# Patient Record
Sex: Male | Born: 1970 | Race: White | Hispanic: No | Marital: Married | Smoking: Current some day smoker
Health system: Southern US, Community
[De-identification: ages and names within clinical notes are randomized; demographics above are authoritative.]

## PROBLEM LIST (undated history)

## (undated) ENCOUNTER — Ambulatory Visit: Admission: EM | Payer: Self-pay | Source: Home / Self Care

## (undated) DIAGNOSIS — Z973 Presence of spectacles and contact lenses: Secondary | ICD-10-CM

## (undated) DIAGNOSIS — E785 Hyperlipidemia, unspecified: Secondary | ICD-10-CM

## (undated) DIAGNOSIS — B019 Varicella without complication: Secondary | ICD-10-CM

## (undated) HISTORY — PX: ELBOW SURGERY: SHX618

## (undated) HISTORY — DX: Hyperlipidemia, unspecified: E78.5

## (undated) HISTORY — DX: Varicella without complication: B01.9

## (undated) HISTORY — PX: VASECTOMY: SHX75

---

## 1998-08-13 ENCOUNTER — Emergency Department (HOSPITAL_COMMUNITY): Admission: EM | Admit: 1998-08-13 | Discharge: 1998-08-13 | Payer: Self-pay | Admitting: Emergency Medicine

## 2006-09-02 ENCOUNTER — Ambulatory Visit: Payer: Self-pay | Admitting: Family Medicine

## 2006-09-02 LAB — CONVERTED CEMR LAB
Albumin: 4.6 g/dL (ref 3.5–5.2)
Alkaline Phosphatase: 49 units/L (ref 39–117)
Basophils Absolute: 0.1 10*3/uL (ref 0.0–0.1)
CO2: 32 meq/L (ref 19–32)
Chol/HDL Ratio, serum: 4.6
Glomerular Filtration Rate, Af Am: 124 mL/min/{1.73_m2}
Glucose, Bld: 87 mg/dL (ref 70–99)
LDL DIRECT: 160.3 mg/dL
Monocytes Relative: 8 % (ref 3.0–11.0)
Neutro Abs: 5.7 10*3/uL (ref 1.4–7.7)
Platelets: 278 10*3/uL (ref 150–400)
Potassium: 4.3 meq/L (ref 3.5–5.1)
RDW: 11.7 % (ref 11.5–14.6)
Sodium: 140 meq/L (ref 135–145)
Total Bilirubin: 0.7 mg/dL (ref 0.3–1.2)
Total Protein: 7.8 g/dL (ref 6.0–8.3)
Triglyceride fasting, serum: 101 mg/dL (ref 0–149)
VLDL: 20 mg/dL (ref 0–40)

## 2007-02-22 ENCOUNTER — Ambulatory Visit: Payer: Self-pay | Admitting: Family Medicine

## 2007-02-22 LAB — CONVERTED CEMR LAB
AST: 27 units/L (ref 0–37)
Albumin: 4.2 g/dL (ref 3.5–5.2)
Basophils Absolute: 0 10*3/uL (ref 0.0–0.1)
Bilirubin, Direct: 0.1 mg/dL (ref 0.0–0.3)
CO2: 31 meq/L (ref 19–32)
Cholesterol: 177 mg/dL (ref 0–200)
Creatinine, Ser: 0.8 mg/dL (ref 0.4–1.5)
Eosinophils Relative: 1.5 % (ref 0.0–5.0)
Glucose, Bld: 77 mg/dL (ref 70–99)
HCT: 45.1 % (ref 39.0–52.0)
Hemoglobin: 15.4 g/dL (ref 13.0–17.0)
LDL Cholesterol: 114 mg/dL — ABNORMAL HIGH (ref 0–99)
MCHC: 34.2 g/dL (ref 30.0–36.0)
Monocytes Absolute: 1 10*3/uL — ABNORMAL HIGH (ref 0.2–0.7)
Neutrophils Relative %: 61.3 % (ref 43.0–77.0)
Potassium: 4.1 meq/L (ref 3.5–5.1)
RDW: 11.6 % (ref 11.5–14.6)
Sodium: 142 meq/L (ref 135–145)
Total Bilirubin: 1 mg/dL (ref 0.3–1.2)
Total Protein: 6.8 g/dL (ref 6.0–8.3)
WBC: 10 10*3/uL (ref 4.5–10.5)

## 2008-03-23 ENCOUNTER — Encounter: Payer: Self-pay | Admitting: *Deleted

## 2008-03-23 ENCOUNTER — Telehealth: Payer: Self-pay | Admitting: *Deleted

## 2008-03-29 ENCOUNTER — Telehealth: Payer: Self-pay | Admitting: *Deleted

## 2008-08-02 ENCOUNTER — Ambulatory Visit: Payer: Self-pay | Admitting: Family Medicine

## 2008-08-02 DIAGNOSIS — S92919A Unspecified fracture of unspecified toe(s), initial encounter for closed fracture: Secondary | ICD-10-CM | POA: Insufficient documentation

## 2009-01-29 ENCOUNTER — Ambulatory Visit: Payer: Self-pay | Admitting: Family Medicine

## 2009-01-29 LAB — CONVERTED CEMR LAB
ALT: 18 units/L (ref 0–53)
BUN: 11 mg/dL (ref 6–23)
Basophils Relative: 0 % (ref 0.0–3.0)
Bilirubin Urine: NEGATIVE
Bilirubin, Direct: 0 mg/dL (ref 0.0–0.3)
CO2: 32 meq/L (ref 19–32)
Chloride: 103 meq/L (ref 96–112)
Cholesterol: 218 mg/dL — ABNORMAL HIGH (ref 0–200)
Creatinine, Ser: 0.9 mg/dL (ref 0.4–1.5)
Direct LDL: 146.1 mg/dL
Eosinophils Absolute: 0.1 10*3/uL (ref 0.0–0.7)
Eosinophils Relative: 1.3 % (ref 0.0–5.0)
Glucose, Urine, Semiquant: NEGATIVE
HCT: 46 % (ref 39.0–52.0)
Lymphs Abs: 2.4 10*3/uL (ref 0.7–4.0)
MCHC: 34.5 g/dL (ref 30.0–36.0)
MCV: 91.5 fL (ref 78.0–100.0)
Monocytes Absolute: 0.9 10*3/uL (ref 0.1–1.0)
Neutrophils Relative %: 65.6 % (ref 43.0–77.0)
Platelets: 268 10*3/uL (ref 150.0–400.0)
Potassium: 4.4 meq/L (ref 3.5–5.1)
Specific Gravity, Urine: 1.02
TSH: 1.49 microintl units/mL (ref 0.35–5.50)
Total Bilirubin: 1 mg/dL (ref 0.3–1.2)
Total Protein: 7.4 g/dL (ref 6.0–8.3)
WBC Urine, dipstick: NEGATIVE
WBC: 10 10*3/uL (ref 4.5–10.5)
pH: 7.5

## 2009-02-14 ENCOUNTER — Ambulatory Visit: Payer: Self-pay | Admitting: Family Medicine

## 2009-02-14 DIAGNOSIS — E785 Hyperlipidemia, unspecified: Secondary | ICD-10-CM

## 2009-04-11 ENCOUNTER — Ambulatory Visit: Payer: Self-pay | Admitting: Family Medicine

## 2009-04-11 LAB — CONVERTED CEMR LAB
ALT: 23 units/L (ref 0–53)
AST: 24 units/L (ref 0–37)
Alkaline Phosphatase: 55 units/L (ref 39–117)
Bilirubin, Direct: 0.1 mg/dL (ref 0.0–0.3)
Cholesterol: 184 mg/dL (ref 0–200)
Total Bilirubin: 1 mg/dL (ref 0.3–1.2)
Total Protein: 7.1 g/dL (ref 6.0–8.3)

## 2011-03-20 NOTE — Assessment & Plan Note (Signed)
[facility]                              [facility] OFFICE NOTE   NAME:[name], [name]                         MRN:          [id number]  DATE:09/02/2006                            DOB:          Dec 13, 1970    NEW PATIENT EVALUATION   This is the first visit for this 40 year old single male who comes in today  for evaluation of three problems as a new patient.   PAST MEDICAL HISTORY:  He had surgery in his right elbow as a [age] year old  because it would not straighten out. He does not recall what triggered it,  probably a fracture when he was younger.   OUTPATIENT SURGERY:  None.   ILLNESSES:  None.   INJURIES:  None.   DRUG ALLERGIES:  None.   HABITS:  He quit smoking a year ago. He drinks occasional drink of alcohol.  Takes no medicines on a regular basis.   FAMILY HISTORY:  Dad's is pertinent. His dad died at age 53 of a sudden MI,  underlying smoker. Son does not know any risk factors other than smoking.  Mother is 29 and has had bypass surgery, also a smoker. No brothers, one  sister.   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 He is single. He  has a daughter. He is divorced. Hopes to be remarried. Home is originally  [place].   PROBLEMS:  Allergies. The patient has history of allergic rhinitis. He says  it is worse in the fall, winter, and spring. Still present in the summer but  not as bad. His symptoms are sneezing, runny nose, watery eyes, headache,  congestion, postnasal drip. He said since he quit smoking it is much better.  When he smokes it makes it worse. He has never had any asthma. He takes over-  the-counter Sudafed with not much help. He is also concerned about his blood  pressure. He is also concerned about his lipids. He has never had a physical  evaluation.   PHYSICAL EXAMINATION:  VITAL SIGNS:  Height 5 feet 6 inches, weight 141, BP  134/77, pulse 85 and regular, temp 97.7.  GENERAL:  He is a  well-developed, well-nourished white male in no acute  distress.  HEENT:  Negative. He does have some septal deviation to the right. HEENT  exam, otherwise, nose looks pretty good. Probably mostly a combination of  smoking every now and then and allergies.  NECK:  Supple. Thyroid not enlarged. No carotid bruits.  CHEST:  Clear to auscultation.  CARDIAC:  Negative.  ABDOMEN:  Negative.   IMPRESSION:  Allergic rhinitis.   PLAN:  1. Claritin OTC along with Flonase nasal spray one shot up each nostril      q.h.s.  2. Normal blood pressure.  3. Question hyperlipidemia. Will check labs.   We talked about risk factors for coronary disease. Weight is good for his  height. Blood pressure is normal. We will check lipids, blood sugar,  metabolic parameters. He does smoke every now and then. Advised to stop  completely and not  smoke at all anymore. Will call when we get back his lab  tests.   Thirty minutes was spent with the patient going through his history,  physical, and explanation.    ______________________________  Eugenio Hoes Tawanna Cooler, MD    JAT/MedQ  DD: 09/02/2006  DT: 09/03/2006  Job #: [id number]

## 2013-04-19 ENCOUNTER — Ambulatory Visit (INDEPENDENT_AMBULATORY_CARE_PROVIDER_SITE_OTHER): Payer: BC Managed Care – PPO | Admitting: Nurse Practitioner

## 2013-04-19 ENCOUNTER — Ambulatory Visit (HOSPITAL_BASED_OUTPATIENT_CLINIC_OR_DEPARTMENT_OTHER)
Admission: RE | Admit: 2013-04-19 | Discharge: 2013-04-19 | Disposition: A | Discharge status: Home / Self Care | Payer: BC Managed Care – PPO | Source: Ambulatory Visit | Attending: Nurse Practitioner | Admitting: Nurse Practitioner

## 2013-04-19 ENCOUNTER — Other Ambulatory Visit: Payer: Self-pay | Admitting: Nurse Practitioner

## 2013-04-19 ENCOUNTER — Encounter: Payer: Self-pay | Admitting: Nurse Practitioner

## 2013-04-19 VITALS — BP 90/60 | HR 57 | Temp 97.7°F | Ht 65.5 in | Wt 134.2 lb

## 2013-04-19 DIAGNOSIS — Z0000 Encounter for general adult medical examination without abnormal findings: Secondary | ICD-10-CM

## 2013-04-19 DIAGNOSIS — M546 Pain in thoracic spine: Secondary | ICD-10-CM | POA: Insufficient documentation

## 2013-04-19 DIAGNOSIS — Z1321 Encounter for screening for nutritional disorder: Secondary | ICD-10-CM

## 2013-04-19 DIAGNOSIS — Z130 Encounter for screening for diseases of the blood and blood-forming organs and certain disorders involving the immune mechanism: Secondary | ICD-10-CM

## 2013-04-19 DIAGNOSIS — Z8249 Family history of ischemic heart disease and other diseases of the circulatory system: Secondary | ICD-10-CM

## 2013-04-19 DIAGNOSIS — Z8639 Personal history of other endocrine, nutritional and metabolic disease: Secondary | ICD-10-CM

## 2013-04-19 DIAGNOSIS — M5030 Other cervical disc degeneration, unspecified cervical region: Secondary | ICD-10-CM | POA: Insufficient documentation

## 2013-04-19 DIAGNOSIS — R202 Paresthesia of skin: Secondary | ICD-10-CM

## 2013-04-19 DIAGNOSIS — R209 Unspecified disturbances of skin sensation: Secondary | ICD-10-CM | POA: Insufficient documentation

## 2013-04-19 DIAGNOSIS — Z862 Personal history of diseases of the blood and blood-forming organs and certain disorders involving the immune mechanism: Secondary | ICD-10-CM

## 2013-04-19 LAB — LIPID PANEL
Cholesterol: 212 mg/dL — ABNORMAL HIGH (ref 0–200)
HDL: 57.3 mg/dL (ref >39.00)
Total CHOL/HDL Ratio: 4
Triglycerides: 130 mg/dL (ref 0.0–149.0)
VLDL: 26 mg/dL (ref 0.0–40.0)

## 2013-04-19 LAB — HEPATIC FUNCTION PANEL
ALT: 19 U/L (ref 0–53)
Bilirubin, Direct: 0 mg/dL (ref 0.0–0.3)
Total Bilirubin: 0.8 mg/dL (ref 0.3–1.2)

## 2013-04-19 LAB — CBC
HCT: 46.4 % (ref 39.0–52.0)
MCHC: 33.6 g/dL (ref 30.0–36.0)
MCV: 93.6 fl (ref 78.0–100.0)
RDW: 13.1 % (ref 11.5–14.6)

## 2013-04-19 LAB — RENAL FUNCTION PANEL
CO2: 27 mEq/L (ref 19–32)
Phosphorus: 3.6 mg/dL (ref 2.3–4.6)

## 2013-04-19 NOTE — Patient Instructions (Addendum)
I will call with blood & xray results. Start 81 mg aspirin daily (enteric coated) for stroke prevention. Start fish oil daily (2 gram of DHA & EPA); or eat 6 oz. salmon weekly (about size of hand) for heart health. Caffeine can cause irregular heart rate and interfere with sleep. Hours of sleep goal should be 6 hours uninterrupted. Pleasure to meet you.

## 2013-04-19 NOTE — Progress Notes (Signed)
Subjective:     [name] is a 42 y.o. male and is here to establish care.The patient reports problems - worsening numbness in arms & hands over several weeks, He is especially concerned about this symptom as his sister is being worked-up for multiple sclerosis and had similar symptoms. Also, he c/o thoracic pain between scapula, occasional heartburn relieved w/OTC antacids, and early family history of heart disease in both parents. Also he is a shift worker for 18 years and reports getting 4-5 hours sleep daily in 2 hour increments. He drinks a pot of coffee and 3-4 monster drinks daily. In addtiion he reports drinking approx. 12 beers weekly and smokes about 5 cigarettes week. Marland Kitchen  History   Social History  . Marital Status: Married    Spouse Name: [name]    Number of Children: 2  . Years of Education: N/A   Occupational History  . [employer]    loads trucks   Social History Main Topics  . Smoking status: Current Some Day Smoker -- 1.00 packs/day for 15 years    Types: Cigarettes, Cigars  . Smokeless tobacco: Former Neurosurgeon     Comment: smokes about 5 cigs/week now  . Alcohol Use: 6.0 oz/week    12 drink(s) per week  . Drug Use: No  . Sexually Active: Yes -- Male partner(s)   Other Topics Concern  . Not on file   Social History Narrative   Lives with wife and 79 year old daughter and 81 year old son. Lived in [place] for about 2 years, recently moved back to [facility]. Has worked 2nd or 3rd shift for 18 years. Currently working 3rd and keeps 42 year old during day. Sleeps in 2 hour increments-gets about 4-5 hours sleep daily. Drinks a pot of coffee plus 3-4 monster drinks daily to stay awake.   Health Maintenance  Topic Date Due  . Influenza Vaccine  07/03/2013  . Tetanus/tdap  02/15/2019    The following portions of the patient's history were reviewed and updated as appropriate: allergies, current medications, past family history, past medical history, past social history, past surgical  history and problem list.  Review of Systems Constitutional: negative, often sleepy, drinks caffeine to stay awake Eyes: has prescription for glasses, but rarely wears them Ears, nose, mouth, throat, and face: had URI recently w.sore throat & runny nose. Symtpoms resolved. Respiratory: negative for asthma, cough, dyspnea on exertion and wheezing Cardiovascular: negative for irregular heart beat, lower extremity edema, near-syncope, orthopnea, palpitations and c/o occasional pressure in low R chest w/deep breathing Gastrointestinal: negative for abdominal pain, constipation, diarrhea, nausea and c/o occasional heartburn relieved w/OTC antacid Integument/breast: negative for rash Musculoskeletal:positive for back pain and lumbar region. Pt lifts boxes on job, repetitive. Neurological: positive for paresthesia and weakness in grip, bilateral Allergic/Immunologic: negative   Objective:    BP 90/60  Pulse 57  Temp(Src) 97.7 F (36.5 C) (Oral)  Ht 5' 5.5" (1.664 m)  Wt 134 lb 4 oz (60.895 kg)  BMI 21.99 kg/m2  SpO2 97% General appearance: alert, cooperative, appears stated age, fatigued, no distress and fell asleep while waiting for paperwork after lab draw. Head: Normocephalic, without obvious abnormality, atraumatic Eyes: negative findings: lids and lashes normal, conjunctivae and sclerae normal, corneas clear, pupils equal, round, reactive to light and accomodation and no nystagmus appreciated on exam Ears: normal TM and external ear canal left ear and abnormal TM right ear - air-fluid level Nose: Nares normal. Septum midline. Mucosa normal. No  drainage or sinus tenderness. Throat: lips, mucosa, and tongue normal; teeth and gums normal Neck: no adenopathy, no carotid bruit, no JVD, supple, symmetrical, trachea midline and thyroid not enlarged, symmetric, no tenderness/mass/nodules Back: symmetric, no curvature. ROM normal. No CVA tenderness. Lungs: clear to auscultation  bilaterally Heart: regular rate and rhythm, S1, S2 normal, no murmur, click, rub or gallop Abdomen: soft, non-tender; bowel sounds normal; no masses,  no organomegaly Extremities: extremities normal, atraumatic, no cyanosis or edema and flat arches, no c/o pain Pulses: 2+ and symmetric Skin: Skin color, texture, turgor normal. No rashes or lesions Lymph nodes: no cervical or supraclavicular LAD Neurologic:bilateral grip is subjectively weak, also R patellar reflex is hyperreflexive compared to L     Assessment:   Family history of heart disease in male family member before age 69  Family history of heart disease in male family member before age 9  Preventative health care - Plan: CBC, Hepatic function panel, Lipid panel, Renal function panel  Numbness and tingling in hands - Plan: CBC, DG Cervical Spine 2 or 3 views, DG Thoracic Spine 2 View, Vitamin D 25 hydroxy, Vitamin B12, TSH  History of hyperlipidemia  Encounter for vitamin deficiency screening       Plan:    1-2. 81 mg ASA for stroke prevention. Cautioned against high caffeine intake-risk arrythmia.  3. Tdap up to date:given 11/2010 4. DD: Vit B def, D def, cervical radiculopathy, other inflammatory neurological disorder 5. 2 gm daily fish oil or 6 oz wild salmon /weekly. Start medication if lipid disorder given fam hx. 6. Screen vit B & D today. See pt instructions. See After Visit Summary for Counseling Recommendations

## 2013-04-20 ENCOUNTER — Telehealth: Payer: Self-pay | Admitting: Nurse Practitioner

## 2013-04-20 DIAGNOSIS — E785 Hyperlipidemia, unspecified: Secondary | ICD-10-CM

## 2013-04-20 DIAGNOSIS — M47812 Spondylosis without myelopathy or radiculopathy, cervical region: Secondary | ICD-10-CM

## 2013-04-20 LAB — LDL CHOLESTEROL, DIRECT: Direct LDL: 141 mg/dL

## 2013-04-20 MED ORDER — DICLOFENAC-MISOPROSTOL 75-0.2 MG PO TBEC: 1.0000 | DELAYED_RELEASE_TABLET | Freq: Two times a day (BID) | ORAL | Status: DC

## 2013-04-20 MED ORDER — SIMVASTATIN 10 MG PO TABS: 10.0000 mg | ORAL_TABLET | Freq: Every day | ORAL | Status: DC

## 2013-04-20 NOTE — Telephone Encounter (Signed)
Plain films of cervical spine show osteophytes at C5-6 and C6-7. After discussing treatment options with pt, he would like to proceed w/conservative Tx of NSAIDS & PT before seeing specialist. Would like to see again in 1 month for response to therapy.

## 2013-04-20 NOTE — Telephone Encounter (Signed)
LDL is  >140, pt has 2 cardiac risk factors: smoking & premature fam Hx. Will start zocor. Vit D in 40's . Recommend 2000iu daily. Discussed all with pt. amswered all questions.

## 2013-05-03 ENCOUNTER — Ambulatory Visit: Payer: BC Managed Care – PPO | Attending: Nurse Practitioner | Admitting: Physical Therapy

## 2013-05-03 DIAGNOSIS — IMO0001 Reserved for inherently not codable concepts without codable children: Secondary | ICD-10-CM | POA: Insufficient documentation

## 2013-05-03 DIAGNOSIS — M25519 Pain in unspecified shoulder: Secondary | ICD-10-CM | POA: Insufficient documentation

## 2013-05-03 DIAGNOSIS — M542 Cervicalgia: Secondary | ICD-10-CM | POA: Insufficient documentation

## 2013-05-17 ENCOUNTER — Ambulatory Visit: Payer: BC Managed Care – PPO | Admitting: Physical Therapy

## 2013-05-24 ENCOUNTER — Ambulatory Visit: Payer: BC Managed Care – PPO | Admitting: Physical Therapy

## 2013-06-19 ENCOUNTER — Ambulatory Visit (INDEPENDENT_AMBULATORY_CARE_PROVIDER_SITE_OTHER): Payer: BC Managed Care – PPO | Admitting: Nurse Practitioner

## 2013-06-19 ENCOUNTER — Encounter: Payer: Self-pay | Admitting: Nurse Practitioner

## 2013-06-19 VITALS — BP 98/58 | HR 68 | Temp 97.9°F | Ht 65.5 in | Wt 133.0 lb

## 2013-06-19 DIAGNOSIS — M47812 Spondylosis without myelopathy or radiculopathy, cervical region: Secondary | ICD-10-CM

## 2013-06-19 DIAGNOSIS — E785 Hyperlipidemia, unspecified: Secondary | ICD-10-CM

## 2013-06-19 MED ORDER — DICLOFENAC-MISOPROSTOL 75-0.2 MG PO TBEC: 1.0000 | DELAYED_RELEASE_TABLET | Freq: Every day | ORAL | Status: DC | PRN

## 2013-06-19 MED ORDER — SIMVASTATIN 10 MG PO TABS: 10.0000 mg | ORAL_TABLET | Freq: Every day | ORAL | Status: DC

## 2013-06-19 NOTE — Progress Notes (Signed)
Subjective:    [name] is here for follow up of elevated cholesterol and arm & hand numbness. Regarding hyperlipidemia, compliance with treatment has been fair -he is taking zocor most days. The patient exercises daily. Patient denies muscle pain associated with his medications. Regarding hand & arm numbness, he has no symptoms at present. C-spine films showed deg changes. He has been taking arthrotec 1 T daily & had several PT visits. He continues to do stretches & exercises at home. Also, he has had job change & is no longer lifting heavy boxes for several hours daily. Of note, he is not working 8 hr night shifts, although he is working from 11p-3a. He says he is getting more sleep.  The following portions of the patient's history were reviewed and updated as appropriate: allergies, current medications, past family history, past medical history, past social history and problem list.  Review of Systems Constitutional: negative for fatigue, fevers, night sweats and weight loss Eyes: positive for contacts/glasses Gastrointestinal: negative for abdominal pain and dyspepsia Musculoskeletal:hand numbness has greatly improved-no longer has numbness when driving Neurological: negative for dizziness, headaches, tremors and weakness Behavioral/Psych: negative for sleep disturbance    Objective:    BP 98/58  Pulse 68  Temp(Src) 97.9 F (36.6 C) (Oral)  Ht 5' 5.5" (1.664 m)  Wt 133 lb (60.328 kg)  BMI 21.79 kg/m2  SpO2 97% General appearance: alert, cooperative, appears stated age and no distress Head: Normocephalic, without obvious abnormality, atraumatic Eyes: negative findings: lids and lashes normal, conjunctivae and sclerae normal, corneas clear and pupils equal, round, reactive to light and accomodation Extremities: extremities normal, atraumatic, no cyanosis or edema Neurologic: Grossly normal, strength equal in hands & arms & LE Back: no point tenderness at spine  Lab Review Lab  Results  Component Value Date   CHOL 212* 04/19/2013   CHOL 184 04/11/2009   CHOL 218* 01/29/2009   HDL 57.30 04/19/2013   HDL 45.40 04/11/2009   HDL 98.11* 01/29/2009   LDLDIRECT 141.0 04/19/2013   LDLDIRECT 146.1 01/29/2009   LDLDIRECT 160.3 09/02/2006      Assessment:   Dyslipidemia: started zocor 6 weeks ago-pt takes several times/week. No c/o SE. Both parents had premature heart disease. Degenerative disc disease w/hand numbness. Symptoms resolved w/arthrotec, stretches, & job change. Has cut caffeine intake way back to 1 energy drink daily. Vit D deficiency Plan:    1. Continue meds, stay active, recheck lipids in 6 months. 2 Continue exercises & strecthes. Take arthrotec as needed. Start Bcomplex supplement. 3 Start Vit D3, 2000iu daily

## 2013-06-19 NOTE — Patient Instructions (Signed)
Congrats on the new jobs! Continue with arthrotec as needed for hand numbness & continue with neck stretches & exercises. Take simvastatin at least 3-4 times weekly & we'll check cholesterol again in 6 months. Consider taking Vitamin B complex daily and vitamin D3 2000 iu daily.  Degenerative Disk Disease Degenerative disk disease is a condition caused by the changes that occur in the cushions of the backbone (spinal disks) as you grow older. Spinal disks are soft and compressible disks located between the bones of the spine (vertebrae). They act like shock absorbers. Degenerative disk disease can affect the whole spine. However, the neck and lower back are most commonly affected. Many changes can occur in the spinal disks with aging, such as:  The spinal disks may dry and shrink.  Small tears may occur in the tough, outer covering of the disk (annulus).  The disk space may become smaller due to loss of water.  Abnormal growths in the bone (spurs) may occur. This can put pressure on the nerve roots exiting the spinal canal, causing pain.  The spinal canal may become narrowed. CAUSES  Degenerative disk disease is a condition caused by the changes that occur in the spinal disks with aging. The exact cause is not known, but there is a genetic basis for many patients. Degenerative changes can occur due to loss of fluid in the disk. This makes the disk thinner and reduces the space between the backbones. Small cracks can develop in the outer layer of the disk. This can lead to the breakdown of the disk. You are more likely to get degenerative disk disease if you are overweight. Smoking cigarettes and doing heavy work such as weightlifting can also increase your risk of this condition. Degenerative changes can start after a sudden injury. Growth of bone spurs can compress the nerve roots and cause pain.  SYMPTOMS  The symptoms vary from person to person. Some people may have no pain, while others have  severe pain. The pain may be so severe that it can limit your activities. The location of the pain depends on the part of your backbone that is affected. You will have neck or arm pain if a disk in the neck area is affected. You will have pain in your back, buttocks, or legs if a disk in the lower back is affected. The pain becomes worse while bending, reaching up, or with twisting movements. The pain may start gradually and then get worse as time passes. It may also start after a major or minor injury. You may feel numbness or tingling in the arms or legs.  DIAGNOSIS  Your caregiver will ask you about your symptoms and about activities or habits that may cause the pain. He or she may also ask about any injuries, diseases, or treatments you have had earlier. Your caregiver will examine you to check for the range of movement that is possible in the affected area, to check for strength in your extremities, and to check for sensation in the areas of the arms and legs supplied by different nerve roots. An X-ray of the spine may be taken. Your caregiver may suggest other imaging tests, such as magnetic resonance imaging (MRI), if needed.  TREATMENT  Treatment includes rest, modifying your activities, and applying ice and heat. Your caregiver may prescribe medicines to reduce your pain and may ask you to do some exercises to strengthen your back. In some cases, you may need surgery. You and your caregiver will decide on  the treatment that is best for you. HOME CARE INSTRUCTIONS   Follow proper lifting and walking techniques as advised by your caregiver.  Maintain good posture.  Exercise regularly as advised.  Perform relaxation exercises.  Change your sitting, standing, and sleeping habits as advised. Change positions frequently.  Lose weight as advised.  Stop smoking if you smoke.  Wear supportive footwear. SEEK MEDICAL CARE IF:  Your pain does not go away within 1 to 4 weeks. SEEK IMMEDIATE  MEDICAL CARE IF:   Your pain is severe.  You notice weakness in your arms, hands, or legs.  You begin to lose control of your bladder or bowel movements. MAKE SURE YOU:   Understand these instructions.  Will watch your condition.  Will get help right away if you are not doing well or get worse. Document Released: 08/16/2007 Document Revised: 01/11/2012 Document Reviewed: 08/16/2007 [facility] Patient Information 2014 [place], [place].

## 2013-12-20 ENCOUNTER — Ambulatory Visit: Payer: BC Managed Care – PPO | Admitting: Nurse Practitioner

## 2014-07-30 IMAGING — CR DG THORACIC SPINE 2V
3 series · 3 of 3 positions shown · non-contrast
Comparison: None.

CLINICAL DATA: Hand and arm numbness

THORACIC SPINE - 2 VIEW

[w t-spine a.p. *]
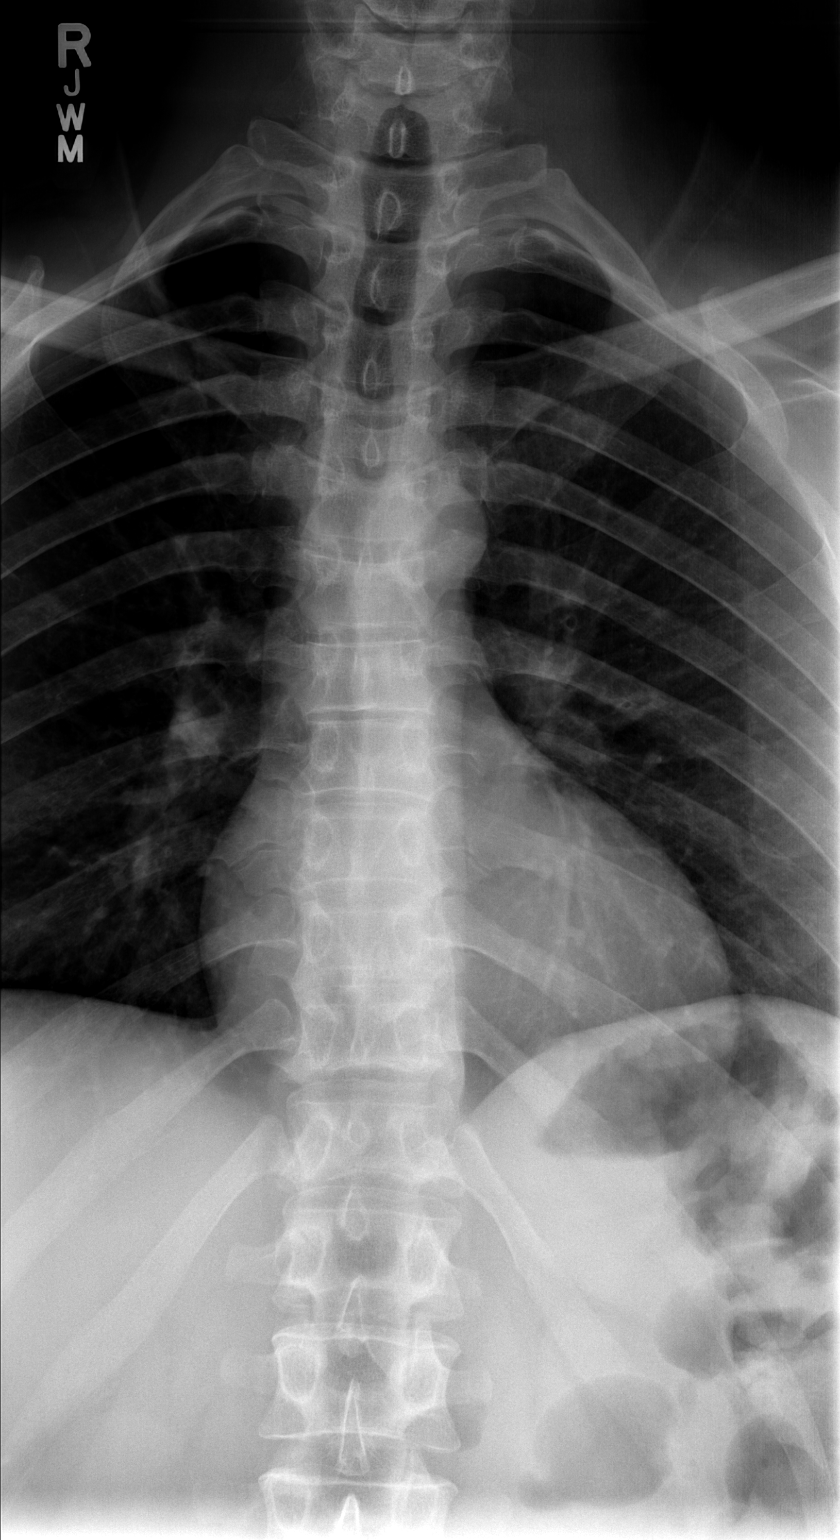

[w t-spine lat *]
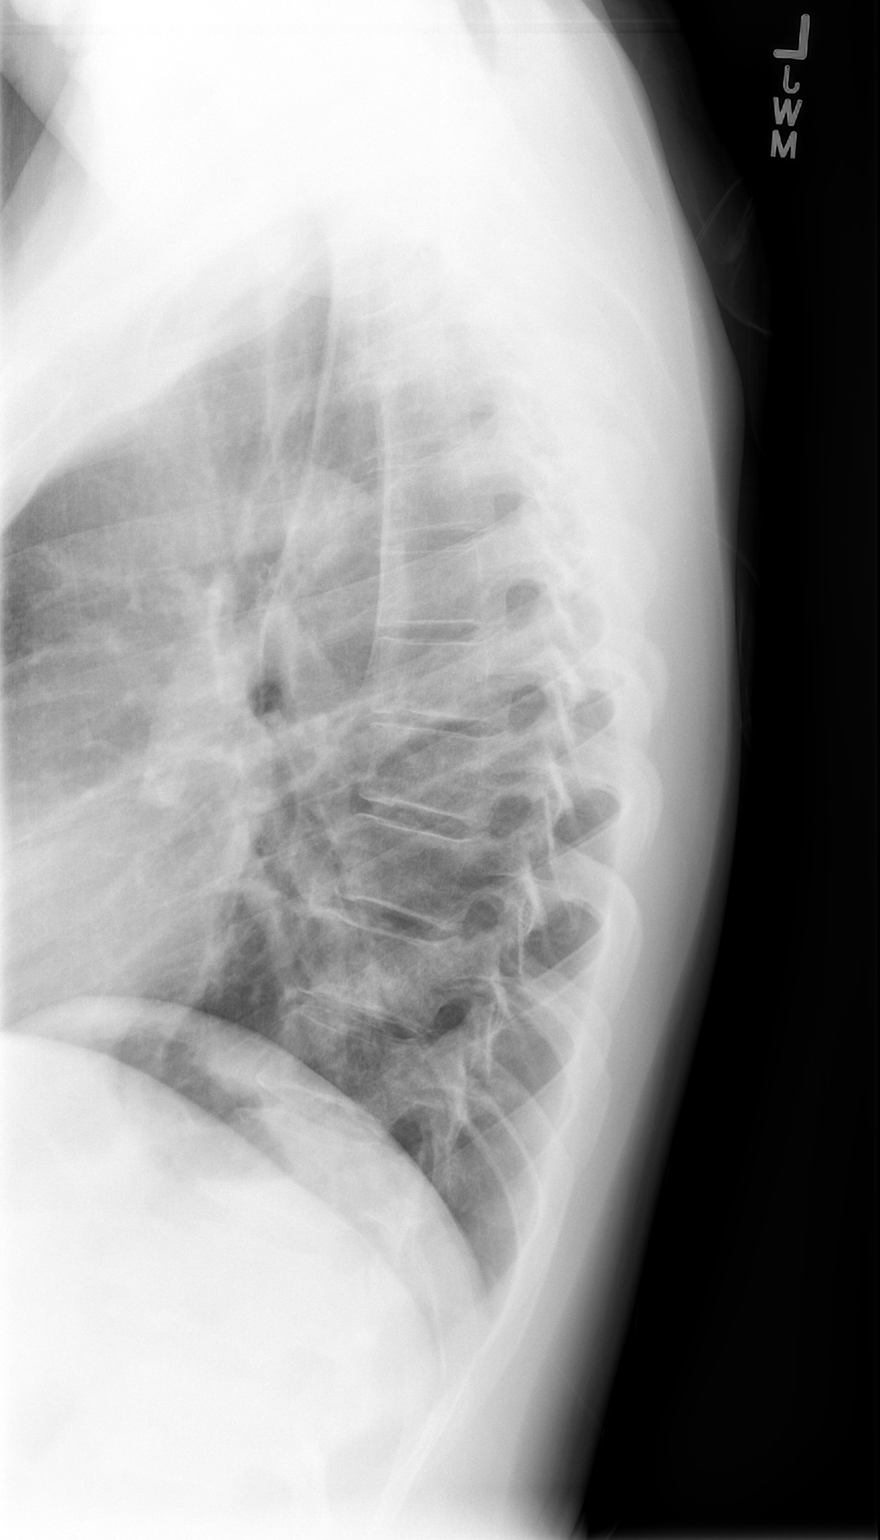

[w swimmers view]
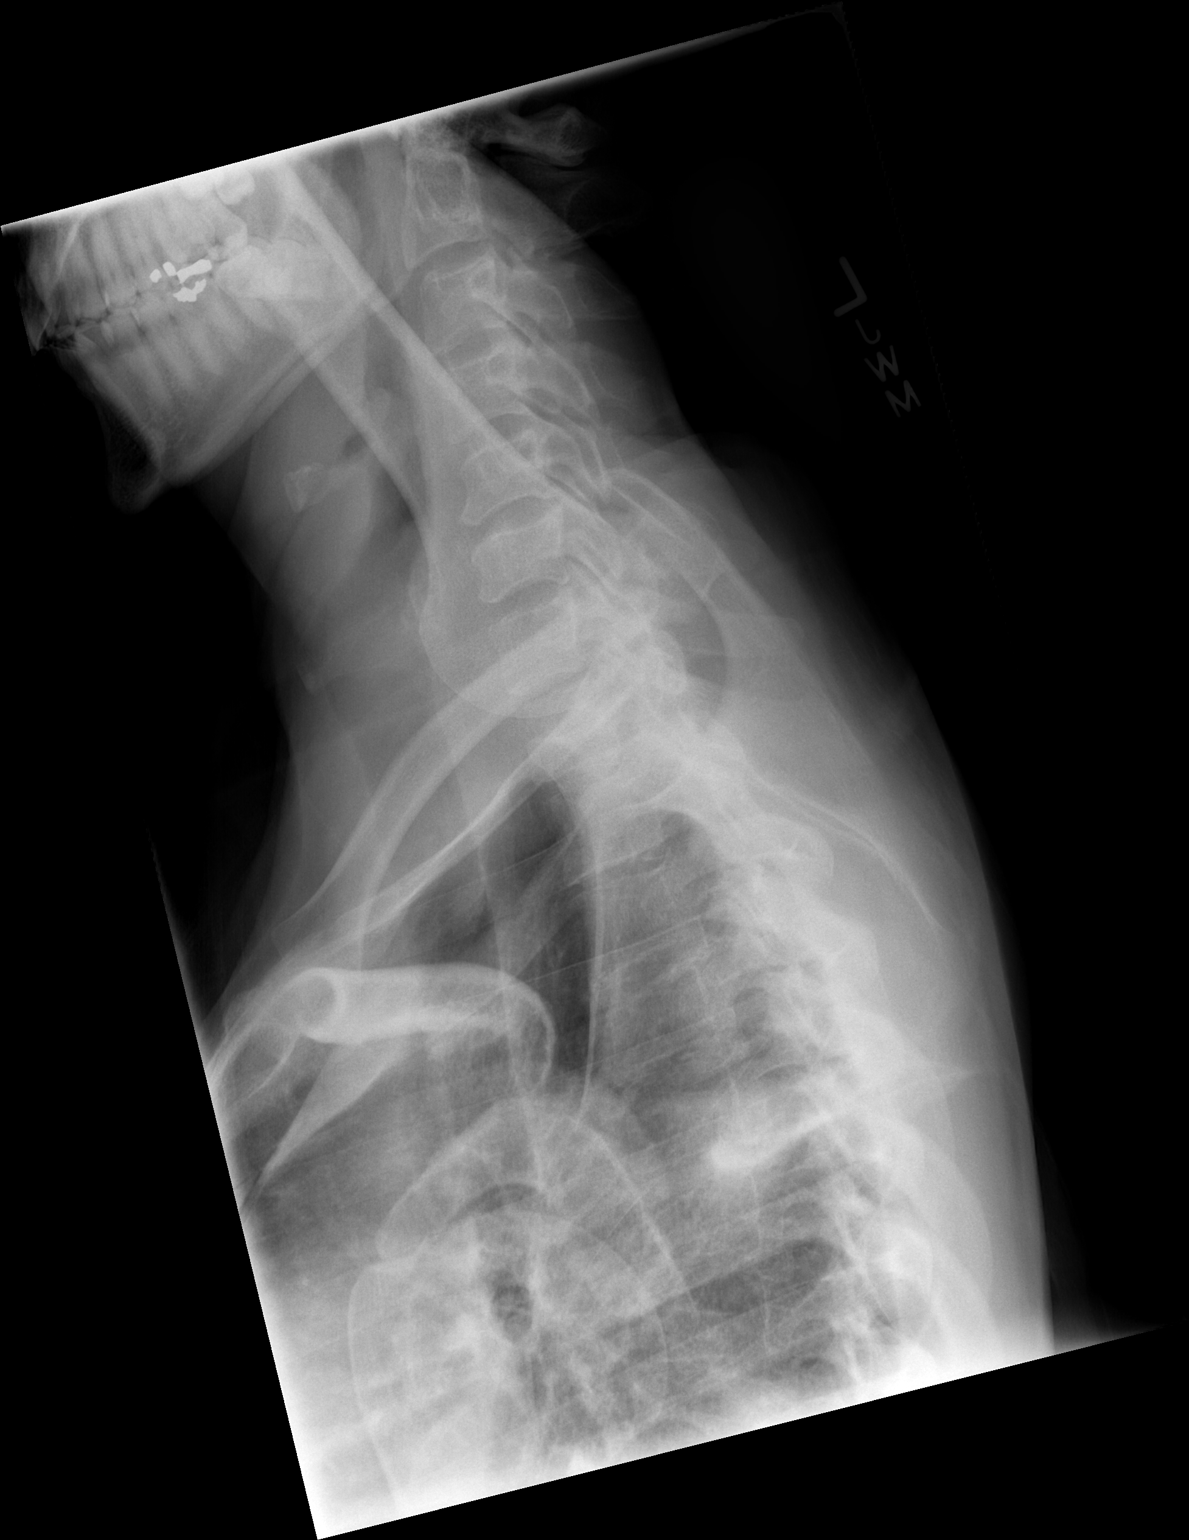

[3 of 3 positions shown; findings below may reference images not displayed]

FINDINGS: Vertebral body height is well-maintained.  The pedicles
within normal limits and no paraspinal mass lesion is noted.
IMPRESSION: No acute abnormality is seen.

## 2014-07-30 IMAGING — CR DG CERVICAL SPINE 2 OR 3 VIEWS
3 series · 3 of 3 positions shown · non-contrast
Comparison: None.

CLINICAL DATA: Arm and hand numbness

CERVICAL SPINE - 2-3 VIEW

[w c-spine lat]
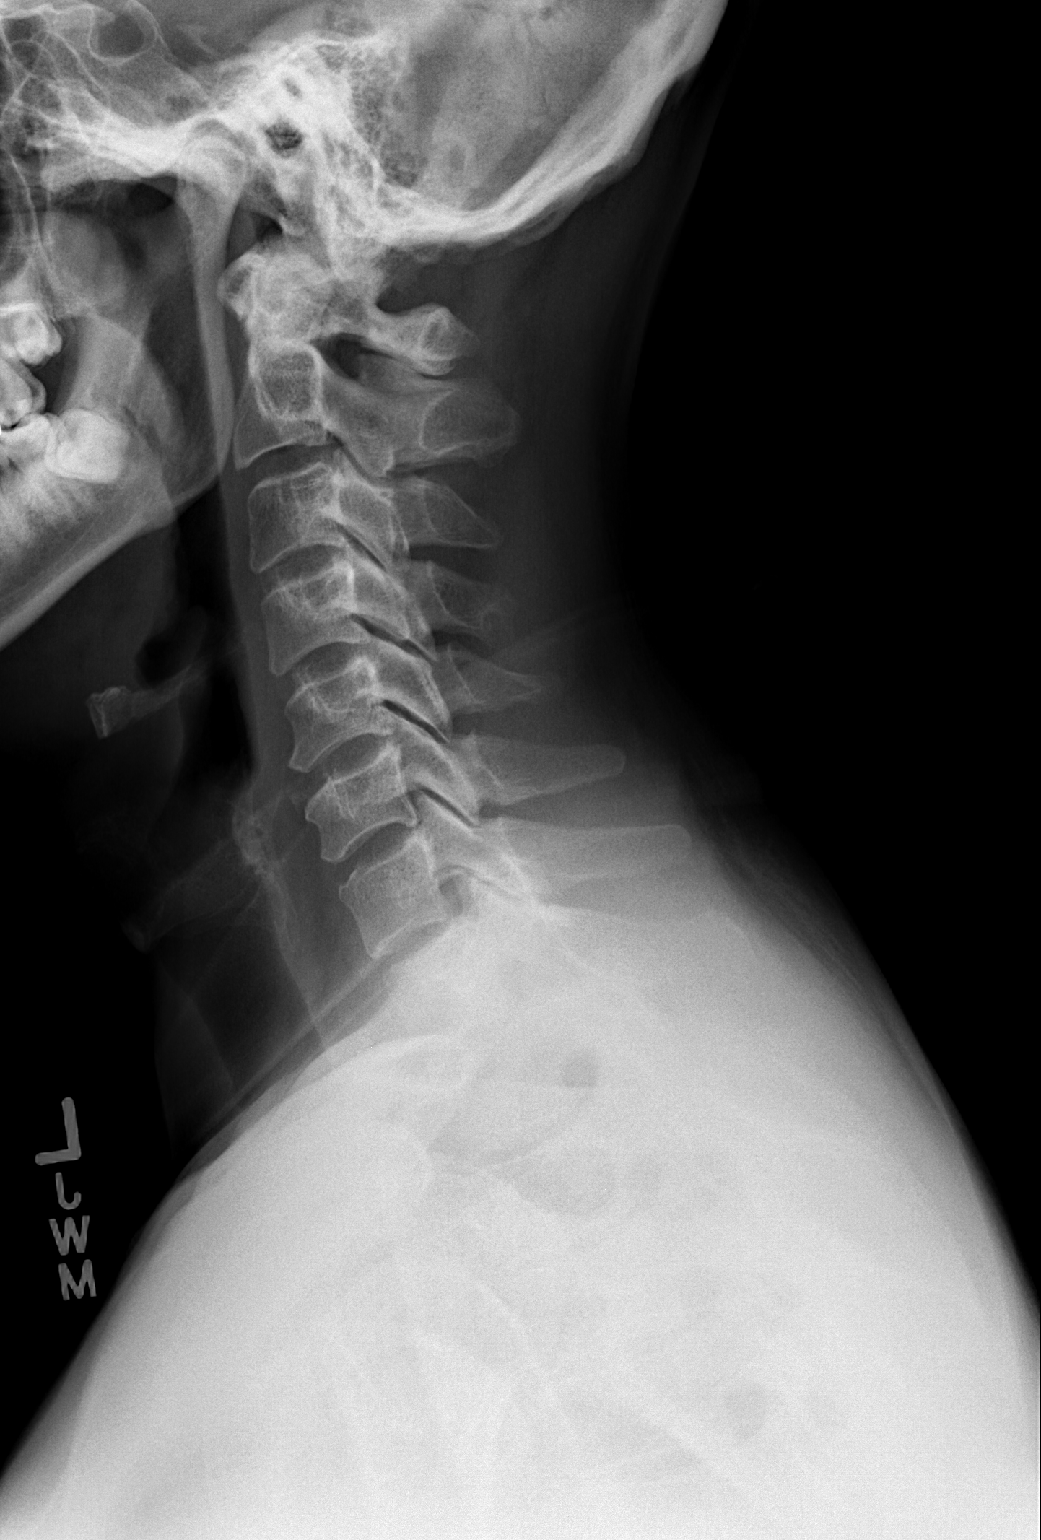

[w c-spine a.p.]
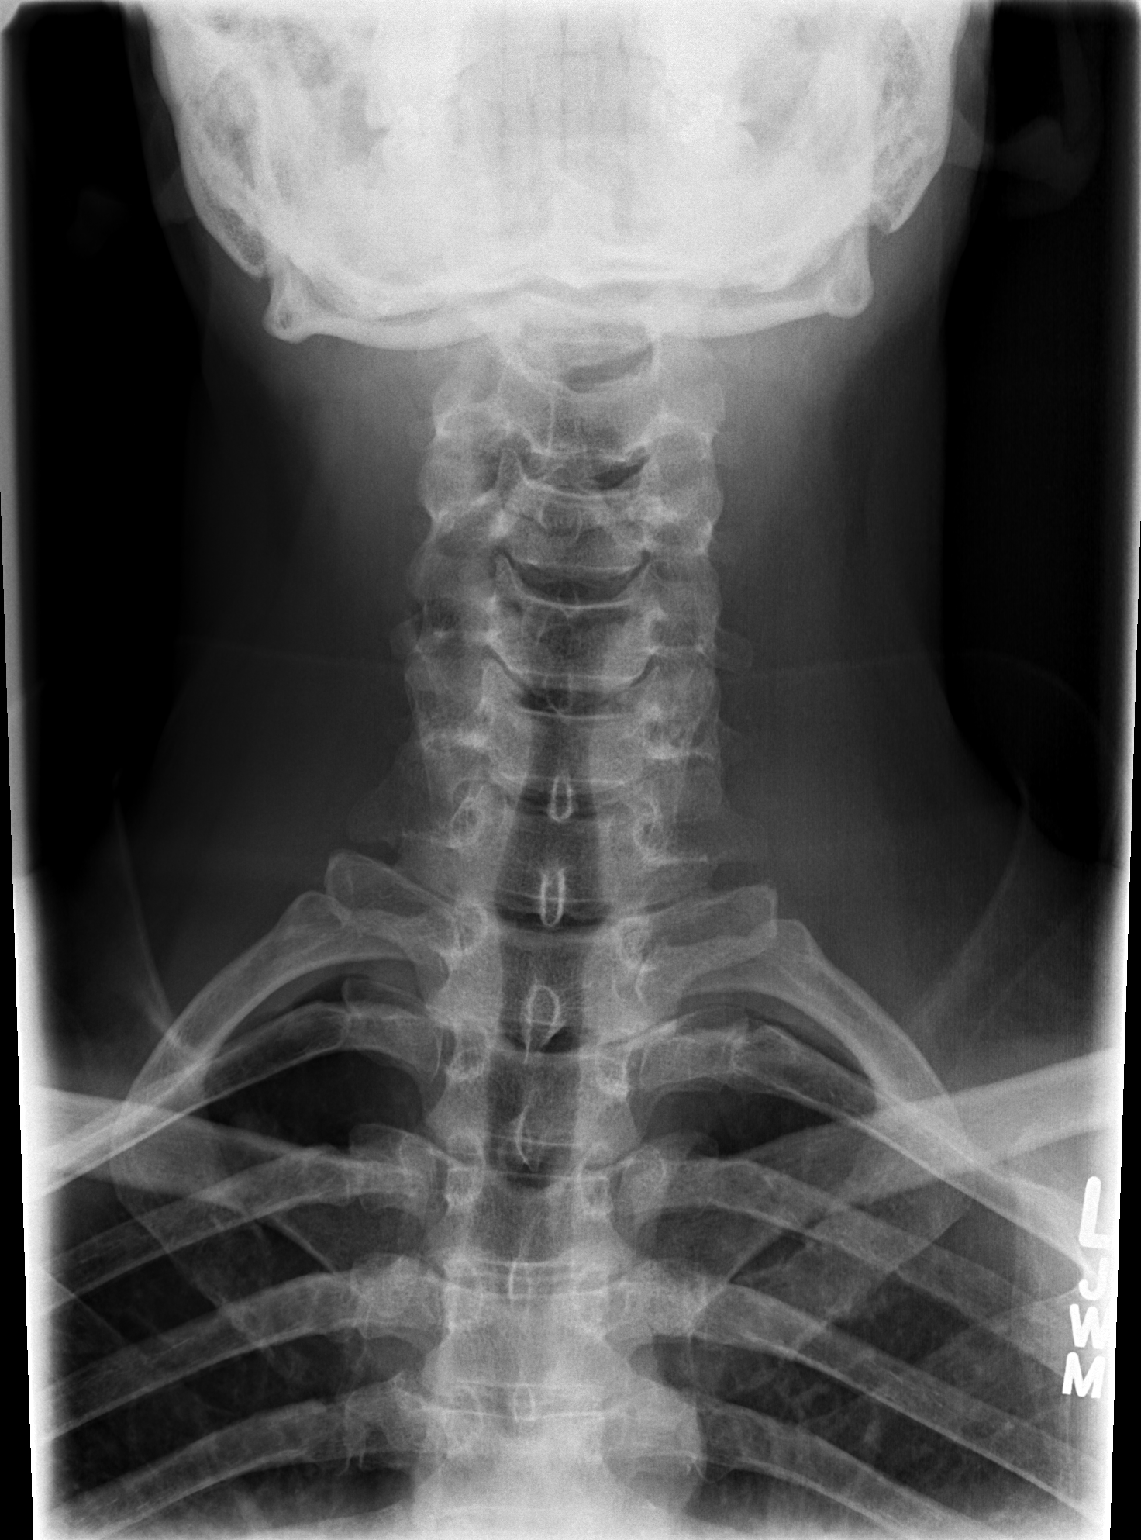

[w c-spine odontoid]
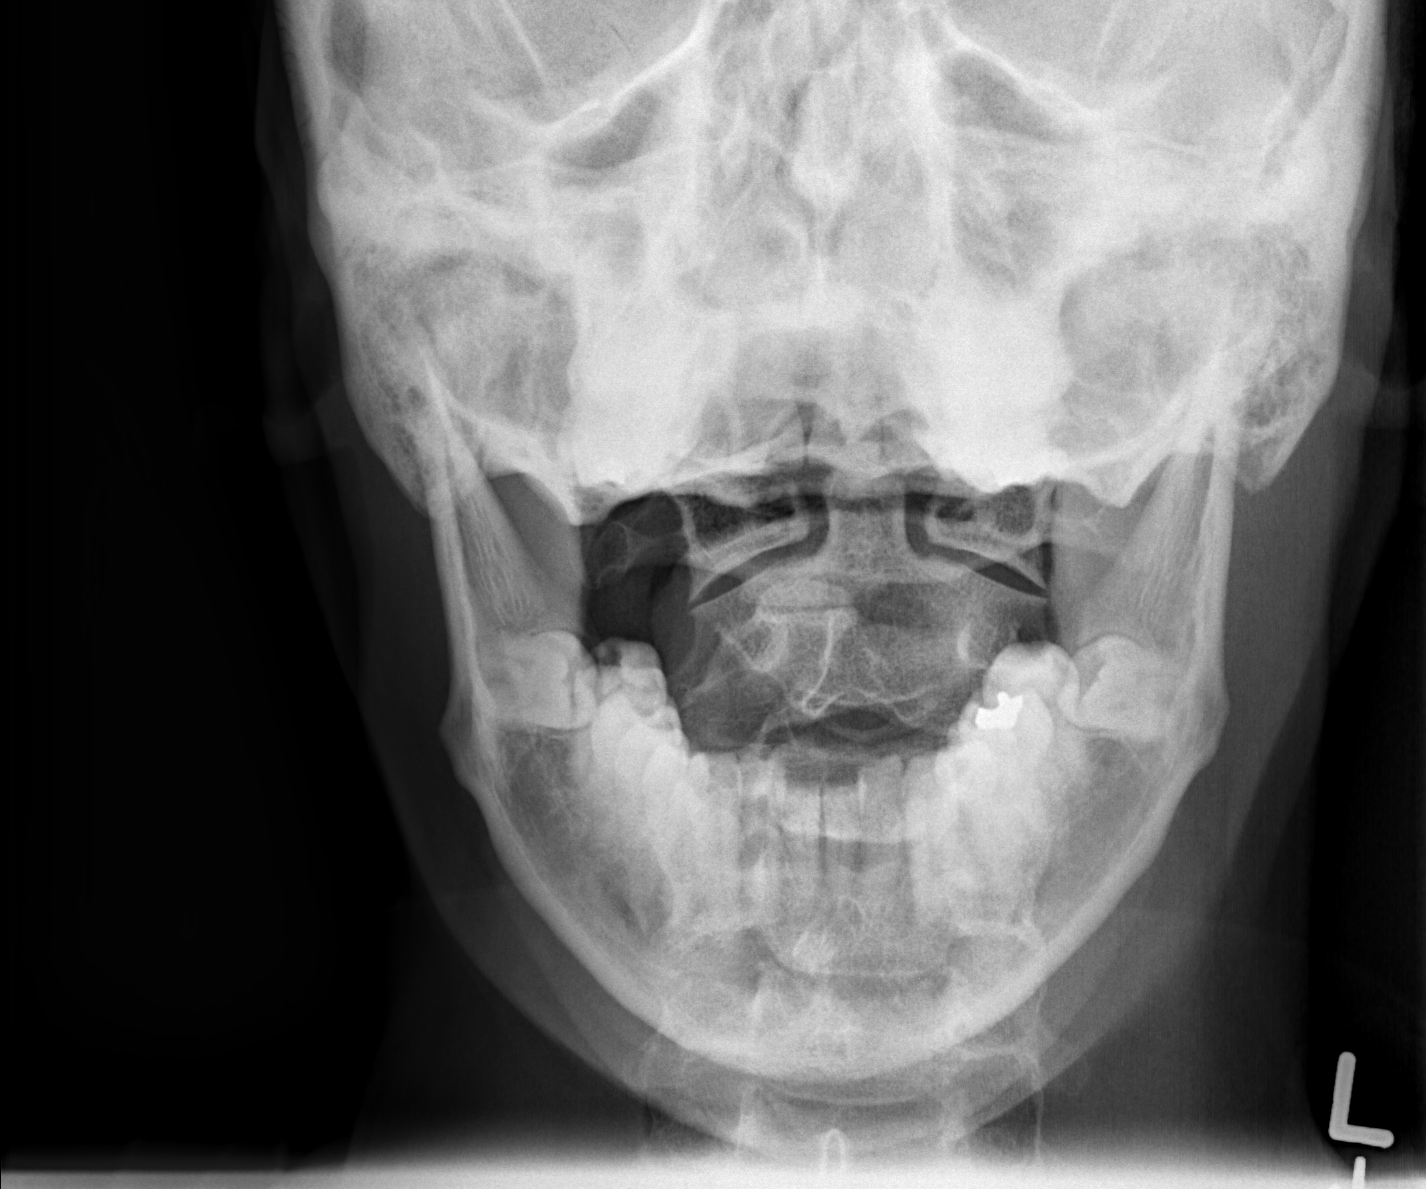

[3 of 3 positions shown; findings below may reference images not displayed]

FINDINGS: Seven cervical segments are well visualized.  Vertebral
body height is well-maintained.  Very minimal osteophytic changes
are noted at C5-6 and C6-7.  The odontoid is within normal limits.
No acute abnormality is noted.
IMPRESSION: Mild degenerative change.  No acute abnormality is seen.

## 2014-08-27 ENCOUNTER — Encounter: Payer: Self-pay | Admitting: Family Medicine

## 2014-08-27 ENCOUNTER — Ambulatory Visit (INDEPENDENT_AMBULATORY_CARE_PROVIDER_SITE_OTHER): Payer: BC Managed Care – PPO | Admitting: Family Medicine

## 2014-08-27 VITALS — BP 143/83 | HR 65 | Temp 97.8°F | Ht 65.5 in | Wt 144.0 lb

## 2014-08-27 DIAGNOSIS — J069 Acute upper respiratory infection, unspecified: Secondary | ICD-10-CM

## 2014-08-27 DIAGNOSIS — I889 Nonspecific lymphadenitis, unspecified: Secondary | ICD-10-CM

## 2014-08-27 DIAGNOSIS — N451 Epididymitis: Secondary | ICD-10-CM

## 2014-08-27 MED ORDER — LEVOFLOXACIN 500 MG PO TABS: 500.0000 mg | ORAL_TABLET | Freq: Every day | ORAL | Status: DC

## 2014-08-27 NOTE — Progress Notes (Signed)
OFFICE NOTE  08/27/2014  CC:  Chief Complaint  Patient presents with  . Groin Pain    Left Side   HPI: Patient is a 43 y.o. Caucasian male who is here for groin pain. Onset 2 days ago, gradually worsening and becoming more constant for about 24h.  Then yesterday and today things have let up some: still with pressure and discomfort.  Works at [employer] and lifts heavy boxes all the time. Points to left side of groin and left testicle.  This area was swollen and a little red.  He applied ice.  Color now looks better and swelling is down some and area not as tender.  He has taken no meds for this.    Has had sinus/nasal congestion, ears feel full.  Sx's for 2 weeks, waxing and waning.  Feels a small lymph node in front of left ear area during this time-not painful to touch. No signif cough.  ST initially but gone now.  HA last week.  No facial pain.  Taking OTC med for cold. +Smoker.   Pertinent PMH:  Past medical, surgical, social, and family history reviewed and no changes are noted since last office visit.  MEDS:  Outpatient Prescriptions Prior to Visit  Medication Sig Dispense Refill  . Diclofenac-Misoprostol 75-0.2 MG TBEC Take 1 tablet by mouth daily as needed.  30 tablet  3  . simvastatin (ZOCOR) 10 MG tablet Take 1 tablet (10 mg total) by mouth at bedtime.  30 tablet  5   No facility-administered medications prior to visit.    PE: Blood pressure 143/83, pulse 65, temperature 97.8 F (36.6 C), temperature source Temporal, height 5' 5.5" (1.664 m), weight 144 lb (65.318 kg), SpO2 100.00%. Gen: Alert, well appearing.  Patient is oriented to person, place, time, and situation. ENT: eyes: no eryth or d/c, nose with scant dried rhinorrhea, ears with normal EACs and TM's bilat. There is a nontender, firm, approx 2 cm left pre-auricular lymph node palpable that is moveable, no overlying skin changes.   CV: RRR, no m/r/g.   LUNGS: CTA bilat, nonlabored resps, good aeration in all lung  fields. GU: no hernia.  Normal penis and testes without lesion.  No scrotal erythema or fullness or mass. Left epididymus is very TTP but not enlarged.   IMPRESSION AND PLAN:  1) Left epididymitis, acute. Levaquin 500 mg qd x 14d. General comfort measures discussed.  2) Left pre-auricular lymph node, nontender, associated with current prolonged URI. Would like to see him back in 2 wks to make sure this node is improved. Symptomatic care for URI discussed.  An After Visit Summary was printed and given to the patient.  FOLLOW UP: 2 wks, f/u node

## 2014-08-27 NOTE — Progress Notes (Signed)
Pre visit review using our clinic review tool, if applicable. No additional management support is needed unless otherwise documented below in the visit note. 

## 2014-08-28 ENCOUNTER — Telehealth: Payer: Self-pay | Admitting: Nurse Practitioner

## 2014-08-28 NOTE — Telephone Encounter (Signed)
emmi emailed °

## 2014-09-10 ENCOUNTER — Ambulatory Visit (INDEPENDENT_AMBULATORY_CARE_PROVIDER_SITE_OTHER): Payer: BC Managed Care – PPO | Admitting: Nurse Practitioner

## 2014-09-10 ENCOUNTER — Encounter: Payer: Self-pay | Admitting: Nurse Practitioner

## 2014-09-10 VITALS — BP 112/68 | HR 65 | Temp 98.1°F | Resp 18 | Ht 65.5 in | Wt 141.0 lb

## 2014-09-10 DIAGNOSIS — R590 Localized enlarged lymph nodes: Secondary | ICD-10-CM | POA: Insufficient documentation

## 2014-09-10 DIAGNOSIS — R599 Enlarged lymph nodes, unspecified: Secondary | ICD-10-CM

## 2014-09-10 NOTE — Patient Instructions (Signed)
Please return in 6 to 8 weeks to re-evaluate lymph node.   If it is not smaller, we will image node or send you to surgeon for biopsy.  If you develop fever or node becomes painful, please let us know, we will want to see you sooner.

## 2014-09-10 NOTE — Progress Notes (Signed)
Pre visit review using our clinic review tool, if applicable. No additional management support is needed unless otherwise documented below in the visit note. 

## 2014-09-10 NOTE — Progress Notes (Signed)
Subjective:     [name] is a 43 y.o. male presents for f/u of epididymitis & L preauricular lymph node swelling. He completed 14 d course levaquin. He states swelling & pain from epididymitis has cleared. He continues to have swollen preauricular lymph node. He thinks it has gotten smaller.  The following portions of the patient's history were reviewed and updated as appropriate: allergies, current medications, past medical history, past social history, past surgical history and problem list.  Review of Systems Constitutional: negative for fatigue, fevers and weight loss Ears, nose, mouth, throat, and face: negative for earaches, hoarseness, nasal congestion and sore throat Respiratory: negative for cough Genitourinary:negative for testicular swelling & pain    Objective:    BP 112/68 mmHg  Pulse 65  Temp(Src) 98.1 F (36.7 C) (Oral)  Resp 18  Ht 5' 5.5" (1.664 m)  Wt 141 lb (63.957 kg)  BMI 23.10 kg/m2  SpO2 100% BP 112/68 mmHg  Pulse 65  Temp(Src) 98.1 F (36.7 C) (Oral)  Resp 18  Ht 5' 5.5" (1.664 m)  Wt 141 lb (63.957 kg)  BMI 23.10 kg/m2  SpO2 100% General appearance: alert, cooperative, appears stated age and no distress Head: Normocephalic, without obvious abnormality, atraumatic Eyes: negative findings: lids and lashes normal and conjunctivae and sclerae normal Ears: normal TM and external ear canal left ear and abnormal TM right ear - air-fluid level Nose: Nares normal. Septum midline. Mucosa normal. No drainage or sinus tenderness. Throat: lips, mucosa, and tongue normal; teeth and gums normal Neck: no adenopathy, supple, symmetrical, trachea midline and thyroid not enlarged, symmetric, no tenderness/mass/nodules Lungs: clear to auscultation bilaterally Heart: regular rate and rhythm, S1, S2 normal, no murmur, click, rub or gallop Lymph nodes: Cervical, supraclavicular, and axillary nodes normal. and swollen moveable L preauricular node approx 2.5 cm wide &  2cm height. Mildly tender to palpation     Assessment:   1. Lymphadenopathy, preauricular Likely due to respiratory infection, resolving F/u in 6 to 8 weeks. If not smaller, will consult surgeon for biopsy.

## 2014-10-02 ENCOUNTER — Encounter: Payer: Self-pay | Admitting: Nurse Practitioner

## 2014-10-02 ENCOUNTER — Ambulatory Visit (INDEPENDENT_AMBULATORY_CARE_PROVIDER_SITE_OTHER): Payer: BC Managed Care – PPO | Admitting: Nurse Practitioner

## 2014-10-02 VITALS — BP 117/79 | HR 61 | Temp 97.9°F | Resp 18 | Ht 65.5 in | Wt 143.0 lb

## 2014-10-02 DIAGNOSIS — J029 Acute pharyngitis, unspecified: Secondary | ICD-10-CM

## 2014-10-02 NOTE — Patient Instructions (Signed)
You have a cold virus causing your symptoms. The average duration of cold symptoms is 14 days. Start daily sinus rinses (neilmed Sinus Rinse). Use 30 mg to 60 mg pseudoephedrine 2 to 3 times daily. Tylenol or ibuprophen for headache. Vicks vapor rub under nose to help breathe. Benzocaine throat lozenges for sore throat. Sip fluids every hour. Rest. If you are not feeling better in 10 days or develop fever or chest pain, call us for re-evaluation. Feel better!  Upper Respiratory Infection, Adult An upper respiratory infection (URI) is also sometimes known as the common cold. The upper respiratory tract includes the nose, sinuses, throat, trachea, and bronchi. Bronchi are the airways leading to the lungs. Most people improve within 1 week, but symptoms can last up to 2 weeks. A residual cough may last even longer.  CAUSES Many different viruses can infect the tissues lining the upper respiratory tract. The tissues become irritated and inflamed and often become very moist. Mucus production is also common. A cold is contagious. You can easily spread the virus to others by oral contact. This includes kissing, sharing a glass, coughing, or sneezing. Touching your mouth or nose and then touching a surface, which is then touched by another person, can also spread the virus. SYMPTOMS  Symptoms typically develop 1 to 3 days after you come in contact with a cold virus. Symptoms vary from person to person. They may include:  Runny nose.  Sneezing.  Nasal congestion.  Sinus irritation.  Sore throat.  Loss of voice (laryngitis).  Cough.  Fatigue.  Muscle aches.  Loss of appetite.  Headache.  Low-grade fever. DIAGNOSIS  You might diagnose your own cold based on familiar symptoms, since most people get a cold 2 to 3 times a year. Your caregiver can confirm this based on your exam. Most importantly, your caregiver can check that your symptoms are not due to another disease such as strep throat,  sinusitis, pneumonia, asthma, or epiglottitis. Blood tests, throat tests, and X-rays are not necessary to diagnose a common cold, but they may sometimes be helpful in excluding other more serious diseases. Your caregiver will decide if any further tests are required. RISKS AND COMPLICATIONS  You may be at risk for a more severe case of the common cold if you smoke cigarettes, have chronic heart disease (such as heart failure) or lung disease (such as asthma), or if you have a weakened immune system. The very young and very old are also at risk for more serious infections. Bacterial sinusitis, middle ear infections, and bacterial pneumonia can complicate the common cold. The common cold can worsen asthma and chronic obstructive pulmonary disease (COPD). Sometimes, these complications can require emergency medical care and may be life-threatening. PREVENTION  The best way to protect against getting a cold is to practice good hygiene. Avoid oral or hand contact with people with cold symptoms. Wash your hands often if contact occurs. There is no clear evidence that vitamin C, vitamin E, echinacea, or exercise reduces the chance of developing a cold. However, it is always recommended to get plenty of rest and practice good nutrition. TREATMENT  Treatment is directed at relieving symptoms. There is no cure. Antibiotics are not effective, because the infection is caused by a virus, not by bacteria. Treatment may include:  Increased fluid intake. Sports drinks offer valuable electrolytes, sugars, and fluids.  Breathing heated mist or steam (vaporizer or shower).  Eating chicken soup or other clear broths, and maintaining good nutrition.  Getting plenty   of rest.  Using gargles or lozenges for comfort.  Controlling fevers with ibuprofen or acetaminophen as directed by your caregiver.  Increasing usage of your inhaler if you have asthma. Zinc gel and zinc lozenges, taken in the first 24 hours of the common  cold, can shorten the duration and lessen the severity of symptoms. Pain medicines may help with fever, muscle aches, and throat pain. A variety of non-prescription medicines are available to treat congestion and runny nose. Your caregiver can make recommendations and may suggest nasal or lung inhalers for other symptoms.  HOME CARE INSTRUCTIONS   Only take over-the-counter or prescription medicines for pain, discomfort, or fever as directed by your caregiver.  Use a warm mist humidifier or inhale steam from a shower to increase air moisture. This may keep secretions moist and make it easier to breathe.  Drink enough water and fluids to keep your urine clear or pale yellow.  Rest as needed.  Return to work when your temperature has returned to normal or as your caregiver advises. You may need to stay home longer to avoid infecting others. You can also use a face mask and careful hand washing to prevent spread of the virus. SEEK MEDICAL CARE IF:   After the first few days, you feel you are getting worse rather than better.  You need your caregiver's advice about medicines to control symptoms.  You develop chills, worsening shortness of breath, or brown or red sputum. These may be signs of pneumonia.  You develop yellow or brown nasal discharge or pain in the face, especially when you bend forward. These may be signs of sinusitis.  You develop a fever, swollen neck glands, pain with swallowing, or white areas in the back of your throat. These may be signs of strep throat. SEEK IMMEDIATE MEDICAL CARE IF:   You have a fever.  You develop severe or persistent headache, ear pain, sinus pain, or chest pain.  You develop wheezing, a prolonged cough, cough up blood, or have a change in your usual mucus (if you have chronic lung disease).  You develop sore muscles or a stiff neck. Document Released: 04/14/2001 Document Revised: 01/11/2012 Document Reviewed: 02/20/2011 [employer] Patient  Information 2014 [employer]. 

## 2014-10-02 NOTE — Progress Notes (Signed)
Pre visit review using our clinic review tool, if applicable. No additional management support is needed unless otherwise documented below in the visit note. 

## 2014-10-02 NOTE — Progress Notes (Signed)
   Subjective:    Patient ID: [name], male    DOB: 09-Dec-1970, 43 y.o.   MRN: [id number]  URI  This is a new problem. The current episode started in the past 7 days (5d). The problem has been unchanged. There has been no fever. Associated symptoms include congestion, coughing, ear pain ("feel stopped up") and a sore throat. Pertinent negatives include no abdominal pain, chest pain, diarrhea or wheezing. He has tried acetaminophen, decongestant and NSAIDs for the symptoms. The treatment provided no relief.      Review of Systems  Constitutional: Negative for fever, chills, activity change, appetite change and fatigue.  HENT: Positive for congestion, ear pain ("feel stopped up"), postnasal drip and sore throat. Negative for voice change.   Respiratory: Positive for cough. Negative for chest tightness, shortness of breath and wheezing.   Cardiovascular: Negative for chest pain.  Gastrointestinal: Negative for abdominal pain and diarrhea.       Objective:   Physical Exam  Constitutional: He is oriented to person, place, and time. He appears well-developed and well-nourished. No distress.  HENT:  Head: Normocephalic and atraumatic.  Right Ear: External ear normal.  Left Ear: External ear normal.  Mouth/Throat: No oropharyngeal exudate.  Erythematous posterior pharynx. Cloudy fluid R TM, bones visible; scant clear fluid L TM, bones visible L preauricular lymph node-no change in sz. Mobile, NT, feels harder than last visit.   Eyes: Conjunctivae are normal. Right eye exhibits no discharge. Left eye exhibits no discharge.  Neck: Normal range of motion. Neck supple. No thyromegaly present.  Cardiovascular: Normal rate, regular rhythm and normal heart sounds.   No murmur heard. Pulmonary/Chest: Effort normal and breath sounds normal. No respiratory distress. He has no wheezes. He has no rales.  Lymphadenopathy:    He has no cervical adenopathy.  Neurological: He is alert and oriented  to person, place, and time.  Skin: Skin is warm and dry.  Psychiatric: He has a normal mood and affect. His behavior is normal. Thought content normal.  Vitals reviewed.         Assessment & Plan:  1. Sore throat Likely viral, also has nasal congestion  - POCT Rapid Strep A; Standing-neg - Upper Respiratory Culture See patient instructions for complete plan. F/u PRN

## 2014-10-04 ENCOUNTER — Telehealth: Payer: Self-pay | Admitting: Nurse Practitioner

## 2014-10-04 NOTE — Telephone Encounter (Signed)
Spoke with pt, advised message about labs. Pt understood.

## 2014-10-04 NOTE — Telephone Encounter (Signed)
pls call pt: Strep culture neg. Viral respiratory/sore throat illness. Continue with comfort measures as discussed. 

## 2014-10-05 LAB — CULTURE, UPPER RESPIRATORY: Organism ID, Bacteria: NORMAL

## 2014-10-22 ENCOUNTER — Ambulatory Visit: Payer: BC Managed Care – PPO | Admitting: Nurse Practitioner

## 2015-01-10 ENCOUNTER — Ambulatory Visit (INDEPENDENT_AMBULATORY_CARE_PROVIDER_SITE_OTHER): Payer: BLUE CROSS/BLUE SHIELD | Admitting: Nurse Practitioner

## 2015-01-10 ENCOUNTER — Encounter: Payer: Self-pay | Admitting: Nurse Practitioner

## 2015-01-10 VITALS — BP 123/86 | HR 78 | Temp 98.3°F | Ht 65.0 in | Wt 139.0 lb

## 2015-01-10 DIAGNOSIS — R599 Enlarged lymph nodes, unspecified: Secondary | ICD-10-CM

## 2015-01-10 DIAGNOSIS — J069 Acute upper respiratory infection, unspecified: Secondary | ICD-10-CM

## 2015-01-10 DIAGNOSIS — B9789 Other viral agents as the cause of diseases classified elsewhere: Secondary | ICD-10-CM

## 2015-01-10 DIAGNOSIS — R590 Localized enlarged lymph nodes: Secondary | ICD-10-CM

## 2015-01-10 MED ORDER — BENZONATATE 200 MG PO CAPS: 200.0000 mg | ORAL_CAPSULE | Freq: Three times a day (TID) | ORAL | Status: DC | PRN

## 2015-01-10 MED ORDER — HYDROCODONE-HOMATROPINE 5-1.5 MG/5ML PO SYRP
5.0000 mL | ORAL_SOLUTION | Freq: Every evening | ORAL | Status: DC | PRN
Start: 2015-01-10 — End: 2015-02-05

## 2015-01-10 NOTE — Progress Notes (Signed)
Pre visit review using our clinic review tool, if applicable. No additional management support is needed unless otherwise documented below in the visit note. 

## 2015-01-10 NOTE — Progress Notes (Signed)
Subjective:     [name] is a 44 y.o. male who c/o nasal congestion, sore throat, HA, cough, ear pain body aches for 5 days. He is taking nyquil w/out relief. Cough is keeping him awake. Chest hurts when coughs in sternal area. He denies fever, diarrhea, nausea.  He also c/o persistent L preauricular LAD. He thinks nodule/lymph node has increased in sz in last few mos. The following portions of the patient's history were reviewed and updated as appropriate: allergies, current medications, past medical history, past social history, past surgical history and problem list.  Review of Systems Constitutional: positive for fatigue, negative for night sweats Musculoskeletal:negative for arthralgias    Objective:    BP 123/86 mmHg  Pulse 78  Temp(Src) 98.3 F (36.8 C) (Temporal)  Ht 5\' 5"  (1.651 m)  Wt 139 lb (63.05 kg)  BMI 23.13 kg/m2  SpO2 97% BP 123/86 mmHg  Pulse 78  Temp(Src) 98.3 F (36.8 C) (Temporal)  Ht 5\' 5"  (1.651 m)  Wt 139 lb (63.05 kg)  BMI 23.13 kg/m2  SpO2 97% General appearance: alert, cooperative, appears stated age and no distress Head: Normocephalic, without obvious abnormality, atraumatic Eyes: negative findings: lids and lashes normal and conjunctivae and sclerae normal Ears: normal TM's and external ear canals both ears Throat: lips, mucosa, and tongue normal; teeth and gums normal Lungs: clear to auscultation bilaterally Heart: regular rate and rhythm, S1, S2 normal, no murmur, click, rub or gallop Lymph nodes: Cervical adenopathy: none, Axillary adenopathy: none and L preauricular node feels spongy-was firm when last examined 3 mos ago, ovoid, 1.5 cm vertical axis. NT Neurologic: Grossly normal    Assessment:Plan     1. Lymphadenopathy, preauricular, exacerbated DD: mass vs LN - US Soft Tissue Head/Neck; Future  2. Viral upper respiratory tract infection with cough, new Symptom management - HYDROcodone-homatropine (HYCODAN) 5-1.5 MG/5ML syrup; Take  5 mLs by mouth at bedtime as needed for cough.  Dispense: 120 mL; Refill: 0 - benzonatate (TESSALON) 200 MG capsule; Take 1 capsule (200 mg total) by mouth 3 (three) times daily as needed for cough.  Dispense: 60 capsule; Refill: 0  See pt instructions F/u PRN [place] results

## 2015-01-10 NOTE — Patient Instructions (Signed)
You have a virus causing your symptoms. The average duration of cold symptoms is 14 days.  For nasal congestion: Start daily sinus rinses (neilmed Sinus Rinse). Use 30 mg to 60 mg pseudoephedrine 2 to 3 times daily. Vicks vapor rub under nose to help breathe. For sore throat: Benzocaine throat lozenges for sore throat. For cough: Take benzonatate capsules when awake for cough. Use cough syrup before sleep. Tylenol or ibuprophen for headache. Sip fluids every hour. Rest. If you are not feeling better in 10 days or develop fever or chest pain, call us for re-evaluation.   Please get ultrasound of lymph node.   Upper Respiratory Infection, Adult An upper respiratory infection (URI) is also sometimes known as the common cold. The upper respiratory tract includes the nose, sinuses, throat, trachea, and bronchi. Bronchi are the airways leading to the lungs. Most people improve within 1 week, but symptoms can last up to 2 weeks. A residual cough may last even longer.  CAUSES Many different viruses can infect the tissues lining the upper respiratory tract. The tissues become irritated and inflamed and often become very moist. Mucus production is also common. A cold is contagious. You can easily spread the virus to others by oral contact. This includes kissing, sharing a glass, coughing, or sneezing. Touching your mouth or nose and then touching a surface, which is then touched by another person, can also spread the virus. SYMPTOMS  Symptoms typically develop 1 to 3 days after you come in contact with a cold virus. Symptoms vary from person to person. They may include:  Runny nose.  Sneezing.  Nasal congestion.  Sinus irritation.  Sore throat.  Loss of voice (laryngitis).  Cough.  Fatigue.  Muscle aches.  Loss of appetite.  Headache.  Low-grade fever. DIAGNOSIS  You might diagnose your own cold based on familiar symptoms, since most people get a cold 2 to 3 times a year. Your  caregiver can confirm this based on your exam. Most importantly, your caregiver can check that your symptoms are not due to another disease such as strep throat, sinusitis, pneumonia, asthma, or epiglottitis. Blood tests, throat tests, and X-rays are not necessary to diagnose a common cold, but they may sometimes be helpful in excluding other more serious diseases. Your caregiver will decide if any further tests are required. RISKS AND COMPLICATIONS  You may be at risk for a more severe case of the common cold if you smoke cigarettes, have chronic heart disease (such as heart failure) or lung disease (such as asthma), or if you have a weakened immune system. The very young and very old are also at risk for more serious infections. Bacterial sinusitis, middle ear infections, and bacterial pneumonia can complicate the common cold. The common cold can worsen asthma and chronic obstructive pulmonary disease (COPD). Sometimes, these complications can require emergency medical care and may be life-threatening. PREVENTION  The best way to protect against getting a cold is to practice good hygiene. Avoid oral or hand contact with people with cold symptoms. Wash your hands often if contact occurs. There is no clear evidence that vitamin C, vitamin E, echinacea, or exercise reduces the chance of developing a cold. However, it is always recommended to get plenty of rest and practice good nutrition. TREATMENT  Treatment is directed at relieving symptoms. There is no cure. Antibiotics are not effective, because the infection is caused by a virus, not by bacteria. Treatment may include:  Increased fluid intake. Sports drinks offer valuable electrolytes,  sugars, and fluids.  Breathing heated mist or steam (vaporizer or shower).  Eating chicken soup or other clear broths, and maintaining good nutrition.  Getting plenty of rest.  Using gargles or lozenges for comfort.  Controlling fevers with ibuprofen or  acetaminophen as directed by your caregiver.  Increasing usage of your inhaler if you have asthma. Zinc gel and zinc lozenges, taken in the first 24 hours of the common cold, can shorten the duration and lessen the severity of symptoms. Pain medicines may help with fever, muscle aches, and throat pain. A variety of non-prescription medicines are available to treat congestion and runny nose. Your caregiver can make recommendations and may suggest nasal or lung inhalers for other symptoms.  HOME CARE INSTRUCTIONS   Only take over-the-counter or prescription medicines for pain, discomfort, or fever as directed by your caregiver.  Use a warm mist humidifier or inhale steam from a shower to increase air moisture. This may keep secretions moist and make it easier to breathe.  Drink enough water and fluids to keep your urine clear or pale yellow.  Rest as needed.  Return to work when your temperature has returned to normal or as your caregiver advises. You may need to stay home longer to avoid infecting others. You can also use a face mask and careful hand washing to prevent spread of the virus. SEEK MEDICAL CARE IF:   After the first few days, you feel you are getting worse rather than better.  You need your caregiver's advice about medicines to control symptoms.  You develop chills, worsening shortness of breath, or brown or red sputum. These may be signs of pneumonia.  You develop yellow or brown nasal discharge or pain in the face, especially when you bend forward. These may be signs of sinusitis.  You develop a fever, swollen neck glands, pain with swallowing, or white areas in the back of your throat. These may be signs of strep throat. SEEK IMMEDIATE MEDICAL CARE IF:   You have a fever.  You develop severe or persistent headache, ear pain, sinus pain, or chest pain.  You develop wheezing, a prolonged cough, cough up blood, or have a change in your usual mucus (if you have chronic lung  disease).  You develop sore muscles or a stiff neck. Document Released: 04/14/2001 Document Revised: 01/11/2012 Document Reviewed: 02/20/2011 [facility] Patient Information 2014 [place], [place].

## 2015-01-11 ENCOUNTER — Telehealth: Payer: Self-pay | Admitting: Nurse Practitioner

## 2015-01-11 ENCOUNTER — Ambulatory Visit (HOSPITAL_BASED_OUTPATIENT_CLINIC_OR_DEPARTMENT_OTHER)
Admission: RE | Admit: 2015-01-11 | Discharge: 2015-01-11 | Disposition: A | Discharge status: Home / Self Care | Payer: BLUE CROSS/BLUE SHIELD | Source: Ambulatory Visit | Attending: Nurse Practitioner | Admitting: Nurse Practitioner

## 2015-01-11 DIAGNOSIS — R220 Localized swelling, mass and lump, head: Secondary | ICD-10-CM | POA: Insufficient documentation

## 2015-01-11 DIAGNOSIS — M542 Cervicalgia: Secondary | ICD-10-CM | POA: Diagnosis present

## 2015-01-11 DIAGNOSIS — K111 Hypertrophy of salivary gland: Secondary | ICD-10-CM

## 2015-01-11 DIAGNOSIS — R590 Localized enlarged lymph nodes: Secondary | ICD-10-CM

## 2015-01-11 NOTE — Telephone Encounter (Signed)
pls call pt: Advise Swelling in front of L ear appears to be a lymph node inside the parotid gland.  Radiologist recommends CT scan of neck if it does not resolve.  We know it has been there for 6 mos. If he wants to have further imaging, I will order.  Alternatively, we could wait 2-3 months & re-evaluate. He is due for physical. We can do all at same time if he wishes.

## 2015-01-14 MED ORDER — AMOXICILLIN-POT CLAVULANATE 875-125 MG PO TABS: 1.0000 | ORAL_TABLET | Freq: Two times a day (BID) | ORAL | Status: DC

## 2015-01-14 NOTE — Telephone Encounter (Signed)
Please Advise? Called and informed patient. He wants to know what we are going to do to treat it? Stating that it is painful and hurts to swallow. He either wants something to go against to help it or wants to see another doctor.

## 2015-01-14 NOTE — Telephone Encounter (Signed)
Pt has new complaints of tenderness at area of L swollen parotid gland. Tenderness radiates to side of neck. Throat is sore.  Start augmentin today. Refer to ENT. Pt advised. Questions answered.

## 2015-02-05 ENCOUNTER — Encounter (HOSPITAL_BASED_OUTPATIENT_CLINIC_OR_DEPARTMENT_OTHER): Payer: Self-pay | Admitting: *Deleted

## 2015-02-05 NOTE — Progress Notes (Signed)
No meds, no labs per anesthesia needed-to bring overnight bag

## 2015-02-07 ENCOUNTER — Ambulatory Visit: Payer: Self-pay | Admitting: Otolaryngology

## 2015-02-07 NOTE — H&P (Signed)
  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 Recommend superficial parotidectomy. Risks and benefits were discussed in detail. All questions were answered. Risks include infection, numbness of the ear, facial nerve injury. He will call to schedule when he is ready. Recommend to stop smoking. Reason For Visit  [name] is here today at the kind request of Nicky Pugh for consultation and opinion for lump left ear area. HPI  6 month history of a parotid mass on the left that is slowly getting larger. It causes no discomfort. No history of skin cancer. He is a light smoker. Allergies  No Known Drug Allergies. Current Meds  Unknown;Antibiotic; RPT. Family Hx  Family history of heart disease: Mother,Father (V17.49) (Z82.49). 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 ROS  12 system ROS was obtained and reviewed on the Health Maintenance form dated today.  Positive responses are shown above.  If the symptom is not checked, the patient has denied it. Vital Signs   Recorded by [facility] on 17 Jan 2015 03:45 PM BP:100/60,  Height: 5 ft 6 in, Weight: 135 lb , BMI: 21.8 kg/m2,  BMI Calculated: 21.79 ,  BSA Calculated: 1.69. Physical Exam  APPEARANCE: Well developed, well nourished, in no acute distress.  Normal affect, in a pleasant mood.  Oriented to time, place and person. COMMUNICATION: Normal voice   HEAD & FACE:  No scars, lesions or masses of head and face.  Sinuses nontender to palpation.    Facial strength symmetric.  No facial lesion, scars, or mass. EYES: EOMI with normal primary gaze alignment. Visual acuity grossly intact.  PERRLA EXTERNAL EAR & NOSE: No scars, lesions or masses  EAC & TYMPANIC MEMBRANE:  EAC shows no obstructing lesions or debris and tympanic membranes are normal bilaterally with good  movement to insufflation. GROSS HEARING: Normal   TMJ:  Nontender  INTRANASAL EXAM: No polyps or purulence.  NASOPHARYNX: Normal, without lesions. LIPS, TEETH & GUMS: No lip lesions, normal dentition and normal gums. ORAL CAVITY/OROPHARYNX:  Oral mucosa moist without lesion or asymmetry of the palate, tongue, tonsil or posterior pharynx. NECK:  Supple without adenopathy or mass, except for a 1 cm rubbery nodule within the midportion of the left parotid gland. THYROID:  Normal with no masses palpable.  NEUROLOGIC:  No gross CN deficits. No nystagmus noted.   LYMPHATIC:  No enlarged nodes palpable. Signature  Electronically signed by : Izora Gala  M.D.; 01/17/2015 4:19 PM EST.

## 2015-02-11 ENCOUNTER — Encounter (HOSPITAL_BASED_OUTPATIENT_CLINIC_OR_DEPARTMENT_OTHER): Payer: Self-pay

## 2015-02-11 ENCOUNTER — Ambulatory Visit (HOSPITAL_BASED_OUTPATIENT_CLINIC_OR_DEPARTMENT_OTHER): Payer: BLUE CROSS/BLUE SHIELD | Admitting: Anesthesiology

## 2015-02-11 ENCOUNTER — Ambulatory Visit (HOSPITAL_BASED_OUTPATIENT_CLINIC_OR_DEPARTMENT_OTHER)
Admission: RE | Admit: 2015-02-11 | Discharge: 2015-02-12 | Disposition: A | Discharge status: Home / Self Care | Payer: BLUE CROSS/BLUE SHIELD | Source: Ambulatory Visit | Attending: Otolaryngology | Admitting: Otolaryngology

## 2015-02-11 ENCOUNTER — Encounter (HOSPITAL_BASED_OUTPATIENT_CLINIC_OR_DEPARTMENT_OTHER)
Admission: RE | Disposition: A | Discharge status: Home / Self Care | Payer: Self-pay | Source: Ambulatory Visit | Attending: Otolaryngology

## 2015-02-11 DIAGNOSIS — D110 Benign neoplasm of parotid gland: Secondary | ICD-10-CM | POA: Insufficient documentation

## 2015-02-11 DIAGNOSIS — F17210 Nicotine dependence, cigarettes, uncomplicated: Secondary | ICD-10-CM | POA: Insufficient documentation

## 2015-02-11 DIAGNOSIS — K118 Other diseases of salivary glands: Secondary | ICD-10-CM | POA: Diagnosis present

## 2015-02-11 HISTORY — DX: Presence of spectacles and contact lenses: Z97.3

## 2015-02-11 HISTORY — PX: PAROTIDECTOMY: SHX2163

## 2015-02-11 LAB — POCT HEMOGLOBIN-HEMACUE: HEMOGLOBIN: 15.6 g/dL (ref 13.0–17.0)

## 2015-02-11 SURGERY — EXCISION, PAROTID GLAND
Anesthesia: General | Site: Face | Laterality: Left

## 2015-02-11 MED ORDER — MIDAZOLAM HCL 5 MG/5ML IJ SOLN
INTRAMUSCULAR | Status: DC | PRN
  Administered 2015-02-11: 2 mg via INTRAVENOUS

## 2015-02-11 MED ORDER — FENTANYL CITRATE 0.05 MG/ML IJ SOLN
INTRAMUSCULAR | Status: AC
  Filled 2015-02-11: qty 6

## 2015-02-11 MED ORDER — HYDROMORPHONE HCL 1 MG/ML IJ SOLN
0.2500 mg | INTRAMUSCULAR | Status: DC | PRN
  Administered 2015-02-11 (×2): 0.5 mg via INTRAVENOUS

## 2015-02-11 MED ORDER — PROMETHAZINE HCL 25 MG/ML IJ SOLN
6.2500 mg | INTRAMUSCULAR | Status: DC | PRN
  Administered 2015-02-11: 6.25 mg via INTRAVENOUS
  Filled 2015-02-11: qty 1

## 2015-02-11 MED ORDER — ONDANSETRON HCL 4 MG/2ML IJ SOLN
INTRAMUSCULAR | Status: DC | PRN
  Administered 2015-02-11: 4 mg via INTRAVENOUS

## 2015-02-11 MED ORDER — IBUPROFEN 100 MG/5ML PO SUSP: 400.0000 mg | Freq: Four times a day (QID) | ORAL | Status: DC | PRN

## 2015-02-11 MED ORDER — HYDROMORPHONE HCL 1 MG/ML IJ SOLN
INTRAMUSCULAR | Status: AC
  Filled 2015-02-11: qty 1

## 2015-02-11 MED ORDER — SUCCINYLCHOLINE CHLORIDE 20 MG/ML IJ SOLN
INTRAMUSCULAR | Status: DC | PRN
  Administered 2015-02-11: 100 mg via INTRAVENOUS

## 2015-02-11 MED ORDER — HYDROCODONE-ACETAMINOPHEN 7.5-325 MG PO TABS: 1.0000 | ORAL_TABLET | Freq: Four times a day (QID) | ORAL | Status: DC | PRN

## 2015-02-11 MED ORDER — PHENOL 1.4 % MT LIQD: 2.0000 | Freq: Three times a day (TID) | OROMUCOSAL | Status: DC | PRN

## 2015-02-11 MED ORDER — FENTANYL CITRATE 0.05 MG/ML IJ SOLN
INTRAMUSCULAR | Status: DC | PRN
  Administered 2015-02-11 (×2): 50 ug via INTRAVENOUS
  Administered 2015-02-11: 100 ug via INTRAVENOUS

## 2015-02-11 MED ORDER — PROMETHAZINE HCL 25 MG PO TABS: 25.0000 mg | ORAL_TABLET | Freq: Four times a day (QID) | ORAL | Status: DC | PRN

## 2015-02-11 MED ORDER — ONDANSETRON HCL 4 MG/2ML IJ SOLN: 4.0000 mg | Freq: Four times a day (QID) | INTRAMUSCULAR | Status: DC | PRN

## 2015-02-11 MED ORDER — MIDAZOLAM HCL 2 MG/2ML IJ SOLN: 1.0000 mg | INTRAMUSCULAR | Status: DC | PRN

## 2015-02-11 MED ORDER — PROMETHAZINE HCL 25 MG RE SUPP: 25.0000 mg | Freq: Four times a day (QID) | RECTAL | Status: DC | PRN

## 2015-02-11 MED ORDER — BACITRACIN ZINC 500 UNIT/GM EX OINT
TOPICAL_OINTMENT | CUTANEOUS | Status: AC
  Filled 2015-02-11: qty 0.9

## 2015-02-11 MED ORDER — DEXTROSE-NACL 5-0.9 % IV SOLN
INTRAVENOUS | Status: DC
  Administered 2015-02-11 (×2): via INTRAVENOUS

## 2015-02-11 MED ORDER — LIDOCAINE-EPINEPHRINE 1 %-1:100000 IJ SOLN
INTRAMUSCULAR | Status: AC
  Filled 2015-02-11: qty 1

## 2015-02-11 MED ORDER — OXYCODONE HCL 5 MG/5ML PO SOLN: 5.0000 mg | Freq: Once | ORAL | Status: AC | PRN

## 2015-02-11 MED ORDER — LIDOCAINE HCL (CARDIAC) 20 MG/ML IV SOLN
INTRAVENOUS | Status: DC | PRN
  Administered 2015-02-11: 70 mg via INTRAVENOUS

## 2015-02-11 MED ORDER — DEXAMETHASONE SODIUM PHOSPHATE 4 MG/ML IJ SOLN
INTRAMUSCULAR | Status: DC | PRN
  Administered 2015-02-11: 10 mg via INTRAVENOUS

## 2015-02-11 MED ORDER — LACTATED RINGERS IV SOLN
INTRAVENOUS | Status: DC
  Administered 2015-02-11 (×2): via INTRAVENOUS

## 2015-02-11 MED ORDER — MORPHINE SULFATE 2 MG/ML IJ SOLN: 1.0000 mg | INTRAMUSCULAR | Status: DC | PRN

## 2015-02-11 MED ORDER — HYDROCODONE-ACETAMINOPHEN 5-325 MG PO TABS
1.0000 | ORAL_TABLET | ORAL | Status: DC | PRN
  Administered 2015-02-11 – 2015-02-12 (×5): 2 via ORAL
  Filled 2015-02-11 (×5): qty 2

## 2015-02-11 MED ORDER — PROPOFOL 10 MG/ML IV EMUL
INTRAVENOUS | Status: AC
  Filled 2015-02-11: qty 50

## 2015-02-11 MED ORDER — OXYCODONE HCL 5 MG PO TABS
5.0000 mg | ORAL_TABLET | Freq: Once | ORAL | Status: AC | PRN
Start: 2015-02-11 — End: 2015-02-11

## 2015-02-11 MED ORDER — CEFAZOLIN SODIUM-DEXTROSE 2-3 GM-% IV SOLR
2.0000 g | INTRAVENOUS | Status: AC
  Administered 2015-02-11: 2 g via INTRAVENOUS

## 2015-02-11 MED ORDER — MIDAZOLAM HCL 2 MG/2ML IJ SOLN
INTRAMUSCULAR | Status: AC
  Filled 2015-02-11: qty 2

## 2015-02-11 MED ORDER — CEFAZOLIN SODIUM-DEXTROSE 2-3 GM-% IV SOLR
INTRAVENOUS | Status: AC
  Filled 2015-02-11: qty 50

## 2015-02-11 MED ORDER — LIDOCAINE HCL 4 % MT SOLN
OROMUCOSAL | Status: DC | PRN
  Administered 2015-02-11: 3 mL via TOPICAL

## 2015-02-11 MED ORDER — PROPOFOL 10 MG/ML IV BOLUS
INTRAVENOUS | Status: DC | PRN
  Administered 2015-02-11: 160 mg via INTRAVENOUS

## 2015-02-11 MED ORDER — FENTANYL CITRATE 0.05 MG/ML IJ SOLN: 50.0000 ug | INTRAMUSCULAR | Status: DC | PRN

## 2015-02-11 SURGICAL SUPPLY — 68 items
APL SKNCLS STERI-STRIP NONHPOA (GAUZE/BANDAGES/DRESSINGS)
ATTRACTOMAT 16X20 MAGNETIC DRP (DRAPES) IMPLANT
BENZOIN TINCTURE PRP APPL 2/3 (GAUZE/BANDAGES/DRESSINGS) IMPLANT
BLADE SURG 15 STRL LF DISP TIS (BLADE) ×1 IMPLANT
BLADE SURG 15 STRL SS (BLADE) ×3
CANISTER SUCT 1200ML W/VALVE (MISCELLANEOUS) ×3 IMPLANT
CLEANER CAUTERY TIP 5X5 PAD (MISCELLANEOUS) ×1 IMPLANT
CLOSURE WOUND 1/2 X4 (GAUZE/BANDAGES/DRESSINGS)
CORDS BIPOLAR (ELECTRODE) ×3 IMPLANT
COVER BACK TABLE 60X90IN (DRAPES) ×3 IMPLANT
COVER MAYO STAND STRL (DRAPES) ×3 IMPLANT
DRAIN JACKSON RD 7FR 3/32 (WOUND CARE) IMPLANT
DRAIN TLS ROUND 10FR (DRAIN) IMPLANT
DRAPE INCISE 23X17 IOBAN STRL (DRAPES) ×2
DRAPE INCISE 23X17 STRL (DRAPES) ×1 IMPLANT
DRAPE INCISE IOBAN 23X17 STRL (DRAPES) ×1 IMPLANT
DRAPE U-SHAPE 76X120 STRL (DRAPES) ×3 IMPLANT
ELECT COATED BLADE 2.86 ST (ELECTRODE) ×3 IMPLANT
ELECT REM PT RETURN 9FT ADLT (ELECTROSURGICAL) ×3
ELECTRODE REM PT RTRN 9FT ADLT (ELECTROSURGICAL) ×1 IMPLANT
EVACUATOR SILICONE 100CC (DRAIN) IMPLANT
GAUZE SPONGE 4X4 16PLY XRAY LF (GAUZE/BANDAGES/DRESSINGS) IMPLANT
GLOVE BIO SURGEON STRL SZ 6.5 (GLOVE) IMPLANT
GLOVE BIO SURGEON STRL SZ7 (GLOVE) IMPLANT
GLOVE BIO SURGEON STRL SZ7.5 (GLOVE) IMPLANT
GLOVE BIO SURGEONS STRL SZ 6.5 (GLOVE)
GLOVE BIOGEL PI IND STRL 8 (GLOVE) IMPLANT
GLOVE BIOGEL PI INDICATOR 8 (GLOVE)
GLOVE ECLIPSE 7.5 STRL STRAW (GLOVE) ×3 IMPLANT
GLOVE ECLIPSE 8.0 STRL XLNG CF (GLOVE) ×2 IMPLANT
GLOVE SS BIOGEL STRL SZ 7.5 (GLOVE) IMPLANT
GLOVE SUPERSENSE BIOGEL SZ 7.5 (GLOVE)
GLOVE SURG SS PI 7.0 STRL IVOR (GLOVE) ×2 IMPLANT
GOWN STRL REUS W/ TWL LRG LVL3 (GOWN DISPOSABLE) ×1 IMPLANT
GOWN STRL REUS W/ TWL XL LVL3 (GOWN DISPOSABLE) ×1 IMPLANT
GOWN STRL REUS W/TWL LRG LVL3 (GOWN DISPOSABLE) ×3
GOWN STRL REUS W/TWL XL LVL3 (GOWN DISPOSABLE) ×6
LIQUID BAND (GAUZE/BANDAGES/DRESSINGS) IMPLANT
LOCATOR NERVE 3 VOLT (DISPOSABLE) IMPLANT
NDL HYPO 25X1 1.5 SAFETY (NEEDLE) IMPLANT
NDL PRECISIONGLIDE 27X1.5 (NEEDLE) IMPLANT
NEEDLE HYPO 25X1 1.5 SAFETY (NEEDLE) IMPLANT
NEEDLE PRECISIONGLIDE 27X1.5 (NEEDLE) IMPLANT
NS IRRIG 1000ML POUR BTL (IV SOLUTION) ×3 IMPLANT
PACK BASIN DAY SURGERY FS (CUSTOM PROCEDURE TRAY) ×3 IMPLANT
PAD CLEANER CAUTERY TIP 5X5 (MISCELLANEOUS) ×2
PENCIL FOOT CONTROL (ELECTRODE) ×3 IMPLANT
PIN SAFETY STERILE (MISCELLANEOUS) IMPLANT
SHEARS HARMONIC 9CM CVD (BLADE) ×3 IMPLANT
SHEET MEDIUM DRAPE 40X70 STRL (DRAPES) IMPLANT
SLEEVE SCD COMPRESS KNEE MED (MISCELLANEOUS) ×2 IMPLANT
SPONGE GAUZE 4X4 12PLY STER LF (GAUZE/BANDAGES/DRESSINGS) IMPLANT
STAPLER VISISTAT 35W (STAPLE) ×2 IMPLANT
STRIP CLOSURE SKIN 1/2X4 (GAUZE/BANDAGES/DRESSINGS) IMPLANT
SUCTION FRAZIER TIP 10 FR DISP (SUCTIONS) IMPLANT
SUT CHROMIC 3 0 PS 2 (SUTURE) ×3 IMPLANT
SUT ETHILON 3 0 PS 1 (SUTURE) ×3 IMPLANT
SUT ETHILON 5 0 P 3 18 (SUTURE)
SUT NYLON ETHILON 5-0 P-3 1X18 (SUTURE) IMPLANT
SUT PLAIN 5 0 P 3 18 (SUTURE) IMPLANT
SUT SILK 3 0 PS 1 (SUTURE) ×3 IMPLANT
SUT SILK 3 0 SH CR/8 (SUTURE) IMPLANT
SUT SILK 4 0 TIES 17X18 (SUTURE) ×3 IMPLANT
SYR BULB 3OZ (MISCELLANEOUS) ×3 IMPLANT
SYR CONTROL 10ML LL (SYRINGE) IMPLANT
TOWEL OR 17X24 6PK STRL BLUE (TOWEL DISPOSABLE) ×6 IMPLANT
TUBE CONNECTING 20'X1/4 (TUBING) ×1
TUBE CONNECTING 20X1/4 (TUBING) ×2 IMPLANT

## 2015-02-11 NOTE — Progress Notes (Signed)
S/p left superficial parotidectomy Facial nerve intact bilaterally Drain holding suction  Will monitor, pain control and drain care PRN

## 2015-02-11 NOTE — Op Note (Signed)
OPERATIVE REPORT  DATE OF SURGERY: 02/11/2015  PATIENT:  [name],  44 y.o. male  PRE-OPERATIVE DIAGNOSIS:  LEFT PAROTID MASS  POST-OPERATIVE DIAGNOSIS:  LEFT PAROTID MASS  PROCEDURE:  Procedure(s): LEFT PAROTIDECTOMY  SURGEON:  Beckie Salts, MD  ASSISTANTS: Dr. Erik Obey  ANESTHESIA:   General   EBL:  15 ml  DRAINS: 7 French round  LOCAL MEDICATIONS USED:  None  SPECIMEN:  Left superficial parotid lobe  COUNTS:  Correct  PROCEDURE DETAILS: The patient was taken to the operating room and placed on the operating table in the supine position. Following induction of general endotracheal anesthesia, the left face was prepped and draped in a standard fashion. A preauricular incision was outlined with a marking pen with extension around the ear lobule and down around the mastoid tip. Electrocautery was used to incise the skin and subcutaneous tissue. The posterior branch of the greater auricular nerve was identified and dissected free of surrounding tissue and preserved. A superficial skin flap was elevated anteriorly sufficiently beyond the palpable tumor. A stay suture was used on the earlobe tacking it posteriorly. The parotid was dissected off of the external meatus cartilage and the upper sternocleidomastoid muscle bringing it forward. A preauricular vessel was ligated between clamps and divided using 4-0 silk ties. Continued dissection revealed the main trunk of the facial nerve. A McCabe dissector was then used to dissected parotid tissue off of the nerve starting with the inferior division. The harmonic dissector was used to divide the parotid tissue. Bipolar cautery was used as needed. There was taken whenever there was any twitching of facial muscles. The upper division was then dissected in a similar fashion. There is a nice cuff of parotid tissue surrounding the entire tumor as the tumor was removed. This was sent for pathologic evaluation. The wound was irrigated with saline.  Hemostasis was completed using bipolar cautery. The drain was exited through separate stab incision inferiorly and secured in place with nylon suture. The incision was reapproximated with a running subcuticular 4-0 chromic suture. The drain was charged. Dermabond was applied to the skin. The patient was awakened extubated and transferred to recovery in stable condition.    PATIENT DISPOSITION:  To PACU, stable

## 2015-02-11 NOTE — Anesthesia Procedure Notes (Signed)
Procedure Name: Intubation Date/Time: 02/11/2015 8:57 AM Performed by: Maryella Shivers Pre-anesthesia Checklist: Patient identified, Emergency Drugs available, Suction available and Patient being monitored Patient Re-evaluated:Patient Re-evaluated prior to inductionOxygen Delivery Method: Circle System Utilized Preoxygenation: Pre-oxygenation with 100% oxygen Intubation Type: IV induction Ventilation: Mask ventilation without difficulty Laryngoscope Size: Mac and 3 Grade View: Grade I Tube type: Oral Tube size: 8.0 mm Number of attempts: 1 Airway Equipment and Method: Stylet and Oral airway Placement Confirmation: ETT inserted through vocal cords under direct vision,  positive ETCO2 and breath sounds checked- equal and bilateral Secured at: 22 cm Tube secured with: Tape Dental Injury: Teeth and Oropharynx as per pre-operative assessment

## 2015-02-11 NOTE — Transfer of Care (Signed)
Immediate Anesthesia Transfer of Care Note  Patient: [name]  Procedure(s) Performed: Procedure(s): LEFT PAROTIDECTOMY (Left)  Patient Location: PACU  Anesthesia Type:General  Level of Consciousness: sedated  Airway & Oxygen Therapy: Patient Spontanous Breathing and Patient connected to face mask oxygen  Post-op Assessment: Report given to RN and Post -op Vital signs reviewed and stable  Post vital signs: Reviewed and stable  Last Vitals:  Filed Vitals:   02/11/15 0758  BP: 137/80  Pulse: 56  Temp: 36.5 C  Resp: 20    Complications: No apparent anesthesia complications

## 2015-02-11 NOTE — Anesthesia Postprocedure Evaluation (Signed)
  Anesthesia Post-op Note  Patient: [name]  Procedure(s) Performed: Procedure(s): LEFT PAROTIDECTOMY (Left)  Patient Location: PACU  Anesthesia Type:General  Level of Consciousness: awake and alert   Airway and Oxygen Therapy: Patient Spontanous Breathing  Post-op Pain: mild  Post-op Assessment: Post-op Vital signs reviewed  Post-op Vital Signs: stable  Last Vitals:  Filed Vitals:   02/11/15 1245  BP:   Pulse: 59  Temp:   Resp:     Complications: No apparent anesthesia complications

## 2015-02-11 NOTE — Interval H&P Note (Signed)
History and Physical Interval Note:  02/11/2015 8:31 AM  [name]  has presented today for surgery, with the diagnosis of LEFT PAROTID MASS  The various methods of treatment have been discussed with the patient and family. After consideration of risks, benefits and other options for treatment, the patient has consented to  Procedure(s): LEFT PAROTIDECTOMY (Left) as a surgical intervention .  The patient's history has been reviewed, patient examined, no change in status, stable for surgery.  I have reviewed the patient's chart and labs.  Questions were answered to the patient's satisfaction.     Hana Trippett

## 2015-02-11 NOTE — H&P (View-Only) (Signed)
  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 Recommend superficial parotidectomy. Risks and benefits were discussed in detail. All questions were answered. Risks include infection, numbness of the ear, facial nerve injury. He will call to schedule when he is ready. Recommend to stop smoking. Reason For Visit  [name] is here today at the kind request of Nicky Pugh for consultation and opinion for lump left ear area. HPI  6 month history of a parotid mass on the left that is slowly getting larger. It causes no discomfort. No history of skin cancer. He is a light smoker. Allergies  No Known Drug Allergies. Current Meds  Unknown;Antibiotic; RPT. Family Hx  Family history of heart disease: Mother,Father (V17.49) (Z82.49). 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 ROS  12 system ROS was obtained and reviewed on the Health Maintenance form dated today.  Positive responses are shown above.  If the symptom is not checked, the patient has denied it. Vital Signs   Recorded by [facility] on 17 Jan 2015 03:45 PM BP:100/60,  Height: 5 ft 6 in, Weight: 135 lb , BMI: 21.8 kg/m2,  BMI Calculated: 21.79 ,  BSA Calculated: 1.69. Physical Exam  APPEARANCE: Well developed, well nourished, in no acute distress.  Normal affect, in a pleasant mood.  Oriented to time, place and person. COMMUNICATION: Normal voice   HEAD & FACE:  No scars, lesions or masses of head and face.  Sinuses nontender to palpation.    Facial strength symmetric.  No facial lesion, scars, or mass. EYES: EOMI with normal primary gaze alignment. Visual acuity grossly intact.  PERRLA EXTERNAL EAR & NOSE: No scars, lesions or masses  EAC & TYMPANIC MEMBRANE:  EAC shows no obstructing lesions or debris and tympanic membranes are normal bilaterally with good  movement to insufflation. GROSS HEARING: Normal   TMJ:  Nontender  INTRANASAL EXAM: No polyps or purulence.  NASOPHARYNX: Normal, without lesions. LIPS, TEETH & GUMS: No lip lesions, normal dentition and normal gums. ORAL CAVITY/OROPHARYNX:  Oral mucosa moist without lesion or asymmetry of the palate, tongue, tonsil or posterior pharynx. NECK:  Supple without adenopathy or mass, except for a 1 cm rubbery nodule within the midportion of the left parotid gland. THYROID:  Normal with no masses palpable.  NEUROLOGIC:  No gross CN deficits. No nystagmus noted.   LYMPHATIC:  No enlarged nodes palpable. Signature  Electronically signed by : Izora Gala  M.D.; 01/17/2015 4:19 PM EST.

## 2015-02-11 NOTE — Discharge Instructions (Signed)
It is okay to shower and use soap and water but do not use any creams, oils or ointment on the incision. °

## 2015-02-11 NOTE — Anesthesia Preprocedure Evaluation (Signed)
Anesthesia Evaluation  Patient identified by MRN, date of birth, ID band Patient awake    History of Anesthesia Complications Negative for: history of anesthetic complications  Airway Mallampati: I  TM Distance: >3 FB Neck ROM: Full    Dental  (+) Teeth Intact   Pulmonary Current Smoker,  breath sounds clear to auscultation        Cardiovascular negative cardio ROS  Rhythm:Regular Rate:Normal     Neuro/Psych    GI/Hepatic negative GI ROS, Neg liver ROS,   Endo/Other  negative endocrine ROS  Renal/GU negative Renal ROS     Musculoskeletal negative musculoskeletal ROS (+)   Abdominal   Peds  Hematology negative hematology ROS (+)   Anesthesia Other Findings   Reproductive/Obstetrics                             Anesthesia Physical Anesthesia Plan  ASA: I  Anesthesia Plan: General   Post-op Pain Management:    Induction: Intravenous  Airway Management Planned: Oral ETT and LMA  Additional Equipment:   Intra-op Plan:   Post-operative Plan: Extubation in OR  Informed Consent: I have reviewed the patients History and Physical, chart, labs and discussed the procedure including the risks, benefits and alternatives for the proposed anesthesia with the patient or authorized representative who has indicated his/her understanding and acceptance.   Dental advisory given  Plan Discussed with: CRNA and Surgeon  Anesthesia Plan Comments:         Anesthesia Quick Evaluation

## 2015-02-12 ENCOUNTER — Encounter (HOSPITAL_BASED_OUTPATIENT_CLINIC_OR_DEPARTMENT_OTHER): Payer: Self-pay | Admitting: Otolaryngology

## 2015-02-12 DIAGNOSIS — D110 Benign neoplasm of parotid gland: Secondary | ICD-10-CM | POA: Diagnosis not present

## 2015-02-12 NOTE — Discharge Summary (Signed)
  Physician Discharge Summary  Patient ID: [name] MRN: [id number] DOB/AGE: 12/10/1970 44 y.o.  Admit date: 02/11/2015 Discharge date: 02/12/2015  Admission Diagnoses:parotid mass  Discharge Diagnoses:  Active Problems:   Parotid mass   Discharged Condition: good  Hospital Course: no complications  Consults: none  Significant Diagnostic Studies: none  Treatments: surgery: parotidectomy  Discharge Exam: Blood pressure 130/88, pulse 61, temperature 98.1 F (36.7 C), temperature source Oral, resp. rate 16, height 5\' 6"  (1.676 m), weight 63.231 kg (139 lb 6.4 oz), SpO2 98 %. PHYSICAL EXAM: Incision excellent, jp removed, facial nerve with minimal lower division weakness.  Disposition: Final discharge disposition not confirmed     Medication List    TAKE these medications        HYDROcodone-acetaminophen 7.5-325 MG per tablet  Commonly known as:  NORCO  Take 1 tablet by mouth every 6 (six) hours as needed for moderate pain.     promethazine 25 MG suppository  Commonly known as:  PHENERGAN  Place 1 suppository (25 mg total) rectally every 6 (six) hours as needed for nausea or vomiting.           Follow-up Information    Follow up with Izora Gala, MD. Schedule an appointment as soon as possible for a visit in 1 week.   Specialty:  Otolaryngology   Contact information:   [address] [place] [address] [phone]       Signed: Izora Gala 02/12/2015, 8:50 AM

## 2016-01-14 ENCOUNTER — Encounter: Payer: Self-pay | Admitting: Family Medicine

## 2016-01-14 ENCOUNTER — Ambulatory Visit (INDEPENDENT_AMBULATORY_CARE_PROVIDER_SITE_OTHER): Payer: BLUE CROSS/BLUE SHIELD | Admitting: Family Medicine

## 2016-01-14 VITALS — BP 123/75 | HR 81 | Temp 99.2°F | Resp 20 | Wt 146.0 lb

## 2016-01-14 DIAGNOSIS — J0100 Acute maxillary sinusitis, unspecified: Secondary | ICD-10-CM | POA: Diagnosis not present

## 2016-01-14 MED ORDER — AMOXICILLIN-POT CLAVULANATE 875-125 MG PO TABS: 1.0000 | ORAL_TABLET | Freq: Two times a day (BID) | ORAL | Status: DC

## 2016-01-14 NOTE — Progress Notes (Signed)
Patient ID: [name], male   DOB: December 15, 1970, 45 y.o.   MRN: [id number]    [name] , Jul 23, 1971, 45 y.o., male MRN: [id number]  CC: Cough Subjective: Pt presents for an acute OV with complaints of cough of  1 day duration.  Associated symptoms include fever, body aches, nausea, headache and nasal congestion at night. He denies vomit, diarrhea, abdominal pain, sore throat, rhinorrhea. Pt has tried tylenol to ease their symptoms. Patient had sinus symptoms 10 days ago, started to become better and then this week symptoms returned.  Both children have been sick over the last week., kids are in daycare.  Declined flu shot this year. UTD tdap Current smoker  No Known Allergies Social History  Substance Use Topics  . Smoking status: Current Some Day Smoker -- 0.25 packs/day for 15 years    Types: Cigarettes, Cigars  . Smokeless tobacco: Former Systems developer     Comment: smokes about 5 cigs/week now  . Alcohol Use: 6.0 oz/week    12 Standard drinks or equivalent per week     Comment: occ   Past Medical History  Diagnosis Date  . Hyperlipidemia   . Chicken pox   . Wears glasses    Past Surgical History  Procedure Laterality Date  . 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 Vasectomy    . Parotidectomy Left 02/11/2015    Procedure: LEFT PAROTIDECTOMY;  Surgeon: Izora Gala, MD;  Location: [facility];  Service: ENT;  Laterality: Left;   Family History  Problem Relation Age of Onset  . Heart disease Father   . Heart disease Paternal Grandfather   . Heart disease Mother   . Multiple sclerosis Sister   . Heart disease Paternal Grandmother      Medication List    Notice  As of 01/14/2016  9:01 AM   You have not been prescribed any medications.      ROS: Negative, with the exception of above mentioned in HPI  Objective:  BP 123/75 mmHg  Pulse 81  Temp(Src) 99.2 F (37.3 C)  Resp 20  Wt 146 lb (66.225 kg)  SpO2 94% Body mass  index is 23.58 kg/(m^2). Gen: febrile. No acute distress. Nontoxic in appearance, well developed, well nourished, male. HENT: AT. [facility]. Bilateral TM visualized, air fluid level bilateral, no erythema or bulging. MMM, no oral lesions. Bilateral nares with erythema and bogginess. Throat without erythema or exudates. No cough or hoarseness on exam. Eyes:Pupils Equal Round Reactive to light, Extraocular movements intact,  Conjunctiva without redness, discharge or icterus.  Neck/lymp/endocrine: Supple, shotty cervical  lymphadenopathy CV: RRR Chest: CTAB, no wheeze or crackles. Good air movement, normal resp effort.  Abd: Soft. NTND. BS present Skin: No rashes, purpura or petechiae.  Neuro: Normal gait. PERLA. EOMi. Alert. Oriented x3   Assessment/Plan: [name] is a 45 y.o. male present for acute OV for  1. Acute maxillary sinusitis, recurrence not specified - amoxicillin-clavulanate (AUGMENTIN) 875-125 MG tablet; Take 1 tablet by mouth 2 (two) times daily.  Dispense: 20 tablet; Refill: 0 - Mucinex (pain) , flonase, nasal saline, Rest, hydrate, tylenol/advil - F/U prn  electronically signed by:  Howard Pouch, DO  [place]

## 2016-01-14 NOTE — Patient Instructions (Signed)

## 2016-01-17 ENCOUNTER — Encounter: Payer: Self-pay | Admitting: *Deleted

## 2016-01-17 ENCOUNTER — Telehealth: Payer: Self-pay | Admitting: Family Medicine

## 2016-01-17 NOTE — Telephone Encounter (Signed)
Pt states that he came in this past Tuesday and saw Dr Raoul Pitch. Pt states he is feeling somewhat better, but still running a fever. Pt asking if this is normal. Also pt states that he has missed work due to this and needs a note.

## 2016-01-17 NOTE — Telephone Encounter (Signed)
Note printed and placed up front for patient to pick up.

## 2016-01-17 NOTE — Telephone Encounter (Signed)
Left message with information and instructions. Patient to call back and let us know what days he missed for his work note.

## 2016-01-17 NOTE — Telephone Encounter (Signed)
Pt was placed on Augmentin, which has excellent coverage for bacteria infection of the sinus and lung, it does take a few days. However, if he has a viral illness the medication will not be affective and will just need time. I would give it a few more days, unless he is worsening.  - If worsening over weekend suggest he be seen in Saturday clinic or urgent care.  - We can provide a note for him for days off work if needed for this week.

## 2016-10-16 ENCOUNTER — Telehealth: Payer: Self-pay | Admitting: Family Medicine

## 2016-10-16 ENCOUNTER — Ambulatory Visit (INDEPENDENT_AMBULATORY_CARE_PROVIDER_SITE_OTHER): Payer: BLUE CROSS/BLUE SHIELD | Admitting: Family Medicine

## 2016-10-16 ENCOUNTER — Encounter: Payer: Self-pay | Admitting: Family Medicine

## 2016-10-16 DIAGNOSIS — J0100 Acute maxillary sinusitis, unspecified: Secondary | ICD-10-CM

## 2016-10-16 MED ORDER — AMOXICILLIN-POT CLAVULANATE 875-125 MG PO TABS: 1.0000 | ORAL_TABLET | Freq: Two times a day (BID) | ORAL | 0 refills | Status: DC

## 2016-10-16 NOTE — Progress Notes (Signed)
[name] , 12/31/1970, 45 y.o., male MRN: [id number] Patient Care Team    Relationship Specialty Notifications Start End  Ma Hillock, DO PCP - General Family Medicine  01/14/16     CC: URI symptoms  Subjective: Pt presents for an acute OV with complaints of URI symptoms of 2 weeks duration.  Associated symptoms include sore throat, dry cough, ear pressure, hoarseness and sinus pressure. He denies fever, chills, nausea, vomit, headache.  Pt has tried theraflu, cold and flu, saline nasal sprays to ease their symptoms.  Pt has declined his flu shot this year. No other sick contacts. Office personal are ill.  Current smoker.   No Known Allergies Social History  Substance Use Topics  . Smoking status: Current Some Day Smoker    Packs/day: 0.25    Years: 15.00    Types: Cigarettes, Cigars  . Smokeless tobacco: Former Systems developer     Comment: smokes about 5 cigs/week now  . Alcohol use 6.0 oz/week    12 Standard drinks or equivalent per week     Comment: occ   Past Medical History:  Diagnosis Date  . Chicken pox   . Hyperlipidemia   . Wears glasses    Past Surgical History:  Procedure Laterality Date  . 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 PAROTIDECTOMY Left 02/11/2015   Procedure: LEFT PAROTIDECTOMY;  Surgeon: Izora Gala, MD;  Location: [place];  Service: ENT;  Laterality: Left;  [facility] VASECTOMY     Family History  Problem Relation Age of Onset  . Heart disease Father   . Heart disease Mother   . Multiple sclerosis Sister   . Heart disease Paternal Grandfather   . Heart disease Paternal Grandmother    Allergies as of 10/16/2016   No Known Allergies     Medication List    as of 10/16/2016 10:52 AM   You have not been prescribed any medications.     No results found for this or any previous visit (from the past 24 hour(s)). No results found.   ROS: Negative, with the exception of above mentioned in  HPI   Objective:  BP 125/75 (BP Location: Right Arm, Patient Position: Sitting, Cuff Size: Normal)   Pulse 67   Temp 98.7 F (37.1 C)   Resp 20   Wt 142 lb 4 oz (64.5 kg)   SpO2 98%   BMI 22.96 kg/m  Body mass index is 22.96 kg/m. Gen: Afebrile. No acute distress. Nontoxic in appearance, well developed, well nourished.  HENT: AT. [facility]. Bilateral TM visualized with air fluid levels and mild buldging. MMM, no oral lesions. Bilateral nares with erythema, swelling and drainage. Throat without erythema or exudates. PND present, milc cough and hoarseness present.  Eyes:Pupils Equal Round Reactive to light, Extraocular movements intact,  Conjunctiva without redness, discharge or icterus. Neck/lymp/endocrine: Supple,no lymphadenopathy CV: RRR  Chest: CTAB, no wheeze or crackles. Good air movement, normal resp effort.  Skin: no rashes, purpura or petechiae.  Neuro: Normal gait. PERLA. EOMi. Alert. Oriented x3   Assessment/Plan: [name] is a 45 y.o. male present for acute OV for  Acute maxillary sinusitis, recurrence not specified - amoxicillin-clavulanate (AUGMENTIN) 875-125 MG tablet; Take 1 tablet by mouth 2 (two) times daily.  Dispense: 20 tablet; Refill: 0 - Augmentin  - mucinex DM, nasal saline, flonase, tylenol/adil - Rest and hydrate  F/u PRN  electronically signed by:  Howard Pouch, DO  [facility] - OR

## 2016-10-16 NOTE — Telephone Encounter (Signed)
Dr. Kuneff pt.  

## 2016-10-16 NOTE — Patient Instructions (Addendum)
We are treating you for a sinus infection.  Augmentin prescribed for every 12 hours, for 10 days. Complete all antibiotics.  Also use  mucinex DM, nasal saline, flonase, tylenol/advil Rest and hydrate     I hope you have a happy holiday.    Please help [place] help you:  It is a privilege to be able to take care of great patients such as yourself. We are honored you have chosen [facility] for your Primary Care home. Below you will find basic instructions that you may need to access in the future. Please help [place] help you by reading the instructions, which cover many of the frequent questions we experience.   Prescription refills and request:  -In order to allow more efficient response time, please call your pharmacy for all refills. They will forward the request electronically to [place]. This allows for the quickest possible response. Request left on a nurse line can take longer to refill, since these are checked as time allows between office patients and other phone calls.  - refill request can take up to 3-5 working days to complete.  - If request is sent electronically and request is appropiate, it is usually completed in 1-2 business days.  - all patients will need to be seen routinely for all chronic medical conditions requiring prescription medications (see follow-up below). If you are overdue for follow up on your condition, you will be asked to make an appointment and we will call in enough medication to cover you until your appointment (up to 30 days).  - all controlled substances will require a face to face visit to request/refill.  - if you desire your prescriptions to go through a new pharmacy, and have an active script at original pharmacy, you will need to call your pharmacy and have scripts transferred to new pharmacy. This is completed between the pharmacy locations and not by your provider.    Results: If any images or labs were ordered, it can take up to 1 week to get results  depending on the test ordered and the lab/facility running and resulting the test. - Normal or stable results, which do not need further discussion, will be released to your mychart immediately with attached note to you. A call will not be generated for normal results. Please make certain to sign up for mychart. If you have questions on how to activate your mychart you can call the front office.  - If your results need further discussion, our office will attempt to contact you via phone, and if unable to reach you after 2 attempts, we will release your abnormal result to your mychart with instructions.  - All results will be automatically released in mychart after 1 week.  - Your provider will provide you with explanation and instruction on all relevant material in your results. Please keep in mind, results and labs may appear confusing or abnormal to the untrained eye, but it does not mean they are actually abnormal for you personally. If you have any questions about your results that are not covered, or you desire more detailed explanation than what was provided, you should make an appointment with your provider to do so.   Our office handles many outgoing and incoming calls daily. If we have not contacted you within 1 week about your results, please check your mychart to see if there is a message first and if not, then contact our office.  In helping with this matter, you help decrease call  volume, and therefore allow [place] to be able to respond to patients needs more efficiently.   Acute office visits (sick visit):  An acute visit is intended for a new problem and are scheduled in shorter time slots to allow schedule openings for patients with new problems. This is the appropriate visit to discuss a new problem. In order to provide you with excellent quality medical care with proper time for you to explain your problem, have an exam and receive treatment with instructions, these appointments should be  limited to one new problem per visit. If you experience a new problem, in which you desire to be addressed, please make an acute office visit, we save openings on the schedule to accommodate you. Please do not save your new problem for any other type of visit, let us take care of it properly and quickly for you.   Follow up visits:  Depending on your condition(s) your provider will need to see you routinely in order to provide you with quality care and prescribe medication(s). Most chronic conditions (Example: hypertension, Diabetes, depression/anxiety... etc), require visits a couple times a year. Your provider will instruct you on proper follow up for your personal medical conditions and history. Please make certain to make follow up appointments for your condition as instructed. Failing to do so could result in lapse in your medication treatment/refills. If you request a refill, and are overdue to be seen on a condition, we will always provide you with a 30 day script (once) to allow you time to schedule.    Medicare wellness (well visit): - we have a wonderful Nurse Maudie Mercury), that will meet with you and provide you will yearly medicare wellness visits. These visits should occur yearly (can not be scheduled less than 1 calendar year apart) and cover preventive health, immunizations, advance directives and screenings you are entitled to yearly through your medicare benefits. Do not miss out on your entitled benefits, this is when medicare will pay for these benefits to be ordered for you.  These are strongly encouraged by your provider and is the appropriate type of visit to make certain you are up to date with all preventive health benefits. If you have not had your medicare wellness exam in the last 12 months, please make certain to schedule one by calling the office and schedule your medicare wellness with Maudie Mercury as soon as possible.   Yearly physical (well visit):  - Adults are recommended to be seen  yearly for physicals. Check with your insurance and date of your last physical, most insurances require one calendar year between physicals. Physicals include all preventive health topics, screenings, medical exam and labs that are appropriate for gender/age and history. You may have fasting labs needed at this visit. This is a well visit (not a sick visit), acute topics should not be covered during this visit.  - Pediatric patients are seen more frequently when they are younger. Your provider will advise you on well child visit timing that is appropriate for your their age. - This is not a medicare wellness visit. Medicare wellness exams do not have an exam portion to the visit. Some medicare companies allow for a physical, some do not allow a yearly physical. If your medicare allows a yearly physical you can schedule the medicare wellness with our nurse Maudie Mercury and have your physical with your provider after, on the same day. Please check with insurance for your full benefits.   Late Policy/No Shows:  - all new  patients should arrive 15-30 minutes earlier than appointment to allow [place] time  to  obtain all personal demographics,  insurance information and for you to complete office paperwork. - All established patients should arrive 10-15 minutes earlier than appointment time to update all information and be checked in .  - In our best efforts to run on time, if you are late for your appointment you will be asked to either reschedule or if able, we will work you back into the schedule. There will be a wait time to work you back in the schedule,  depending on availability.  - If you are unable to make it to your appointment as scheduled, please call 24 hours ahead of time to allow [place] to fill the time slot with someone else who needs to be seen. If you do not cancel your appointment ahead of time, you may be charged a no show fee.

## 2016-10-16 NOTE — Telephone Encounter (Signed)
Patient Name: [name]  DOB: September 18, 1971    Initial Comment Caller states has been coughing x 2 weeks, coughing up yellow mucus   Nurse Assessment  Nurse: Raphael Gibney, RN, Vanita Ingles Date/Time (Eastern Time): 10/16/2016 8:56:42 AM  Confirm and document reason for call. If symptomatic, describe symptoms. ---Caller states he has been coughing for 2 weeks. He has a productive cough with yellow phlegm. Does not think he has a fever. Has had nasal congestion and has been using the nasal wash which helps some.  Does the patient have any new or worsening symptoms? ---Yes  Will a triage be completed? ---Yes  Related visit to physician within the last 2 weeks? ---No  Does the PT have any chronic conditions? (i.e. diabetes, asthma, etc.) ---No  Is this a behavioral health or substance abuse call? ---No     Guidelines    Guideline Title Affirmed Question Affirmed Notes  Cough - Acute Productive Cough present > 10 days    Final Disposition User   See PCP When Office is Open (within 3 days) Raphael Gibney, RN, Vanita Ingles    Comments  appt scheduled for 10:30 am on 10/16/2016 with Dr. Howard Pouch   Disagree/Comply: Comply

## 2017-10-07 ENCOUNTER — Ambulatory Visit: Payer: BLUE CROSS/BLUE SHIELD | Admitting: Family Medicine

## 2017-10-07 ENCOUNTER — Encounter: Payer: Self-pay | Admitting: Family Medicine

## 2017-10-07 VITALS — BP 113/62 | HR 64 | Temp 98.6°F | Resp 16 | Wt 142.5 lb

## 2017-10-07 DIAGNOSIS — R6889 Other general symptoms and signs: Secondary | ICD-10-CM

## 2017-10-07 DIAGNOSIS — J111 Influenza due to unidentified influenza virus with other respiratory manifestations: Secondary | ICD-10-CM

## 2017-10-07 DIAGNOSIS — R69 Illness, unspecified: Secondary | ICD-10-CM

## 2017-10-07 LAB — POC INFLUENZA A&B (BINAX/QUICKVUE)
Influenza A, POC: NEGATIVE
Influenza B, POC: NEGATIVE

## 2017-10-07 MED ORDER — OSELTAMIVIR PHOSPHATE 75 MG PO CAPS: 75.0000 mg | ORAL_CAPSULE | Freq: Two times a day (BID) | ORAL | 0 refills | Status: DC

## 2017-10-07 NOTE — Patient Instructions (Signed)
Get otc generic robitussin DM OR Mucinex DM and use as directed on the packaging for cough and congestion. Use otc generic saline nasal spray 2-3 times per day to irrigate/moisturize your nasal passages.   

## 2017-10-07 NOTE — Progress Notes (Signed)
OFFICE VISIT  10/07/2017   CC:  Chief Complaint  Patient presents with  . Flu    ?, son was Dx with flu and Pneumonia    HPI:    Patient is a 46 y.o.  male who presents for "possible flu". Onset about 36h ago. No fever.  Achy/malaise, cough, nasal congestion/runny nose, some ST that is gone today. Son with + flu test test yesterday.  Pt has not been vaccinate for flu this year.  Past Medical History:  Diagnosis Date  . Chicken pox   . Hyperlipidemia   . Wears glasses     Past Surgical History:  Procedure Laterality Date  . 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 PAROTIDECTOMY Left 02/11/2015   Procedure: LEFT PAROTIDECTOMY;  Surgeon: Izora Gala, MD;  Location: [facility];  Service: ENT;  Laterality: Left;  Marland Kitchen VASECTOMY      Outpatient Medications Prior to Visit  Medication Sig Dispense Refill  . amoxicillin-clavulanate (AUGMENTIN) 875-125 MG tablet Take 1 tablet by mouth 2 (two) times daily. (Patient not taking: Reported on 10/07/2017) 20 tablet 0   No facility-administered medications prior to visit.     No Known Allergies  ROS As per HPI  PE: Blood pressure 113/62, pulse 64, temperature 98.6 F (37 C), temperature source Temporal, resp. rate 16, weight 142 lb 8 oz (64.6 kg), SpO2 98 %. VS: noted--normal. Gen: alert, NAD, NONTOXIC APPEARING. HEENT: eyes without injection, drainage, or swelling.  Ears: EACs clear, TMs with normal light reflex and landmarks.  Nose: Clear rhinorrhea, with some dried, crusty exudate adherent to mildly injected mucosa.  No purulent d/c.  No paranasal sinus TTP.  No facial swelling.  Throat and mouth without focal lesion.  No pharyngial swelling, erythema, or exudate.   Neck: supple, no LAD.   LUNGS: CTA bilat, nonlabored resps.   CV: RRR, no m/r/g. EXT: no c/c/e SKIN: no rash   LABS:    Chemistry      Component Value Date/Time   NA 134 (L) 04/19/2013 0909   K 3.8 04/19/2013 0909   CL 97 04/19/2013 0909   CO2 27 04/19/2013 0909   BUN 12 04/19/2013 0909   CREATININE 0.8 04/19/2013 0909      Component Value Date/Time   CALCIUM 9.1 04/19/2013 0909   ALKPHOS 53 04/19/2013 0909   AST 24 04/19/2013 0909   ALT 19 04/19/2013 0909   BILITOT 0.8 04/19/2013 0909      Rapid flu A/B NEG today.  IMPRESSION AND PLAN:  ILI-with neg flu but son with positive flu testing yesterday at pediatrician's office. Pt has not had flu vaccine. Discussed trial of tamiflu today and pt was in favor of taking this med. Tamiflu 75mg  bid x 5d. Therapeutic expectations and side effect profile of medication discussed today.  Patient's questions answered. Get otc generic robitussin DM OR Mucinex DM and use as directed on the packaging for cough and congestion. Use otc generic saline nasal spray 2-3 times per day to irrigate/moisturize your nasal passages.  An After Visit Summary was printed and given to the patient.  FOLLOW UP: Return if symptoms worsen or fail to improve.  Signed:  Crissie Sickles, MD           10/07/2017

## 2018-02-04 ENCOUNTER — Encounter: Payer: Self-pay | Admitting: Family Medicine

## 2018-02-04 ENCOUNTER — Ambulatory Visit: Payer: BLUE CROSS/BLUE SHIELD | Admitting: Family Medicine

## 2018-02-04 VITALS — BP 106/68 | HR 65 | Temp 97.2°F | Resp 16 | Ht 66.0 in | Wt 144.5 lb

## 2018-02-04 DIAGNOSIS — J069 Acute upper respiratory infection, unspecified: Secondary | ICD-10-CM

## 2018-02-04 DIAGNOSIS — B9789 Other viral agents as the cause of diseases classified elsewhere: Secondary | ICD-10-CM | POA: Diagnosis not present

## 2018-02-04 DIAGNOSIS — R062 Wheezing: Secondary | ICD-10-CM | POA: Diagnosis not present

## 2018-02-04 DIAGNOSIS — J209 Acute bronchitis, unspecified: Secondary | ICD-10-CM

## 2018-02-04 MED ORDER — ALBUTEROL SULFATE HFA 108 (90 BASE) MCG/ACT IN AERS: 1.0000 | INHALATION_SPRAY | RESPIRATORY_TRACT | 0 refills | Status: DC | PRN

## 2018-02-04 MED ORDER — PREDNISONE 20 MG PO TABS: ORAL_TABLET | ORAL | 0 refills | Status: DC

## 2018-02-04 MED ORDER — IPRATROPIUM-ALBUTEROL 0.5-2.5 (3) MG/3ML IN SOLN
3.0000 mL | Freq: Once | RESPIRATORY_TRACT | Status: AC
  Administered 2018-02-04: 3 mL via RESPIRATORY_TRACT

## 2018-02-04 NOTE — Patient Instructions (Signed)
Get otc generic robitussin DM OR Mucinex DM and use as directed on the packaging for cough and congestion. Use otc generic saline nasal spray 2-3 times per day to irrigate/moisturize your nasal passages.   

## 2018-02-04 NOTE — Progress Notes (Signed)
OFFICE VISIT  02/04/2018   CC:  Chief Complaint  Patient presents with  . Cough  . URI    HPI:    Patient is a 47 y.o.  male who presents for respiratory symptoms. Nasal congestion, PND, dry cough, feeling SOB---all going on for 1 week and not improving. No fevers.  Mild ST from coughing.  NO HA.   +Wheezing.  No hx of asthma. +Smoker.  Tried nyquil.  No cough meds.  Past Medical History:  Diagnosis Date  . Chicken pox   . Hyperlipidemia   . Wears glasses     Past Surgical History:  Procedure Laterality Date  . 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 PAROTIDECTOMY Left 02/11/2015   Procedure: LEFT PAROTIDECTOMY;  Surgeon: Izora Gala, MD;  Location: [facility];  Service: ENT;  Laterality: Left;  Marland Kitchen VASECTOMY      Outpatient Medications Prior to Visit  Medication Sig Dispense Refill  . oseltamivir (TAMIFLU) 75 MG capsule Take 1 capsule (75 mg total) by mouth 2 (two) times daily. (Patient not taking: Reported on 02/04/2018) 10 capsule 0   No facility-administered medications prior to visit.     No Known Allergies  ROS As per HPI  PE: Blood pressure 106/68, pulse 65, temperature (!) 97.2 F (36.2 C), temperature source Oral, resp. rate 16, height 5\' 6"  (1.676 m), weight 144 lb 8 oz (65.5 kg), SpO2 99 %. VS: noted--normal. Gen: alert, NAD, NONTOXIC APPEARING. HEENT: eyes without injection, drainage, or swelling.  Ears: EACs clear, TMs with normal light reflex and landmarks.  Nose: Clear rhinorrhea, with some dried, crusty exudate adherent to mildly injected mucosa.  No purulent d/c.  No paranasal sinus TTP.  No facial swelling.  Throat and mouth without focal lesion.  No pharyngial swelling, erythema, or exudate.   Neck: supple, no LAD.   LUNGS: CTA bilat on inspiration, with trace end-exp wheezing, nonlabored resps.  He coughs frequently as he tries to talk. CV: RRR, no m/r/g. EXT: no c/c/e SKIN: no rash   LABS:   Chemistry      Component Value Date/Time   NA 134 (L) 04/19/2013 0909   K 3.8 04/19/2013 0909   CL 97 04/19/2013 0909   CO2 27 04/19/2013 0909   BUN 12 04/19/2013 0909   CREATININE 0.8 04/19/2013 0909      Component Value Date/Time   CALCIUM 9.1 04/19/2013 0909   ALKPHOS 53 04/19/2013 0909   AST 24 04/19/2013 0909   ALT 19 04/19/2013 0909   BILITOT 0.8 04/19/2013 0909      IMPRESSION AND PLAN:  Acute URI with bronchitis, viral etiology suspected. Improved with alb/atr neb in office today. Prednisone 40mg  qd x 5d, then 20mg  qd x 5d. Albut HFA eRx'd: 2 p q4h prn. Get otc generic robitussin DM OR Mucinex DM and use as directed on the packaging for cough and congestion. Use otc generic saline nasal spray 2-3 times per day to irrigate/moisturize your nasal passages.  An After Visit Summary was printed and given to the patient.  FOLLOW UP: Return if symptoms worsen or fail to improve.  Signed:  Crissie Sickles, MD           02/04/2018

## 2018-04-22 ENCOUNTER — Encounter: Payer: BLUE CROSS/BLUE SHIELD | Admitting: Family Medicine

## 2018-05-20 ENCOUNTER — Encounter: Payer: Self-pay | Admitting: Family Medicine

## 2018-05-20 ENCOUNTER — Ambulatory Visit (INDEPENDENT_AMBULATORY_CARE_PROVIDER_SITE_OTHER): Payer: BLUE CROSS/BLUE SHIELD | Admitting: Family Medicine

## 2018-05-20 VITALS — BP 124/85 | HR 50 | Temp 98.2°F | Resp 20 | Ht 66.0 in | Wt 142.5 lb

## 2018-05-20 DIAGNOSIS — Z716 Tobacco abuse counseling: Secondary | ICD-10-CM

## 2018-05-20 DIAGNOSIS — E782 Mixed hyperlipidemia: Secondary | ICD-10-CM | POA: Diagnosis not present

## 2018-05-20 DIAGNOSIS — Z130 Encounter for screening for diseases of the blood and blood-forming organs and certain disorders involving the immune mechanism: Secondary | ICD-10-CM | POA: Diagnosis not present

## 2018-05-20 DIAGNOSIS — E785 Hyperlipidemia, unspecified: Secondary | ICD-10-CM | POA: Insufficient documentation

## 2018-05-20 DIAGNOSIS — Z131 Encounter for screening for diabetes mellitus: Secondary | ICD-10-CM | POA: Diagnosis not present

## 2018-05-20 DIAGNOSIS — Z23 Encounter for immunization: Secondary | ICD-10-CM

## 2018-05-20 DIAGNOSIS — Z0000 Encounter for general adult medical examination without abnormal findings: Secondary | ICD-10-CM | POA: Diagnosis not present

## 2018-05-20 DIAGNOSIS — F17200 Nicotine dependence, unspecified, uncomplicated: Secondary | ICD-10-CM

## 2018-05-20 LAB — COMPREHENSIVE METABOLIC PANEL
ALT: 14 U/L (ref 0–53)
AST: 15 U/L (ref 0–37)
Albumin: 4.6 g/dL (ref 3.5–5.2)
Alkaline Phosphatase: 53 U/L (ref 39–117)
BILIRUBIN TOTAL: 0.7 mg/dL (ref 0.2–1.2)
BUN: 14 mg/dL (ref 6–23)
CALCIUM: 9.8 mg/dL (ref 8.4–10.5)
CO2: 30 mEq/L (ref 19–32)
CREATININE: 0.98 mg/dL (ref 0.40–1.50)
Chloride: 104 mEq/L (ref 96–112)
GFR: 87.27 mL/min (ref >60.00)
Glucose, Bld: 91 mg/dL (ref 70–99)
Potassium: 5 mEq/L (ref 3.5–5.1)
Sodium: 140 mEq/L (ref 135–145)
Total Protein: 7.2 g/dL (ref 6.0–8.3)

## 2018-05-20 LAB — CBC WITH DIFFERENTIAL/PLATELET
BASOS ABS: 0.1 10*3/uL (ref 0.0–0.1)
BASOS PCT: 0.8 % (ref 0.0–3.0)
EOS ABS: 0.3 10*3/uL (ref 0.0–0.7)
Eosinophils Relative: 3.1 % (ref 0.0–5.0)
HEMATOCRIT: 48.2 % (ref 39.0–52.0)
Hemoglobin: 16.7 g/dL (ref 13.0–17.0)
LYMPHS PCT: 30.4 % (ref 12.0–46.0)
Lymphs Abs: 2.5 10*3/uL (ref 0.7–4.0)
MCHC: 34.6 g/dL (ref 30.0–36.0)
MCV: 92.6 fl (ref 78.0–100.0)
Monocytes Absolute: 0.8 10*3/uL (ref 0.1–1.0)
Monocytes Relative: 9.4 % (ref 3.0–12.0)
NEUTROS ABS: 4.6 10*3/uL (ref 1.4–7.7)
Neutrophils Relative %: 56.3 % (ref 43.0–77.0)
PLATELETS: 283 10*3/uL (ref 150.0–400.0)
RBC: 5.21 Mil/uL (ref 4.22–5.81)
RDW: 13.5 % (ref 11.5–15.5)
WBC: 8.2 10*3/uL (ref 4.0–10.5)

## 2018-05-20 LAB — LIPID PANEL
CHOLESTEROL: 220 mg/dL — AB (ref 0–200)
HDL: 55.9 mg/dL (ref >39.00)
LDL Cholesterol: 143 mg/dL — ABNORMAL HIGH (ref 0–99)
NonHDL: 163.6
TRIGLYCERIDES: 103 mg/dL (ref 0.0–149.0)
Total CHOL/HDL Ratio: 4
VLDL: 20.6 mg/dL (ref 0.0–40.0)

## 2018-05-20 LAB — TSH: TSH: 1.09 u[IU]/mL (ref 0.35–4.50)

## 2018-05-20 LAB — HEMOGLOBIN A1C: Hgb A1c MFr Bld: 5.2 % (ref 4.6–6.5)

## 2018-05-20 NOTE — Progress Notes (Signed)
Patient ID: [name], male  DOB: 1971/03/15, 47 y.o.   MRN: [id number] Patient Care Team    Relationship Specialty Notifications Start End  Ma Hillock, DO PCP - General Family Medicine  01/14/16     Chief Complaint  Patient presents with  . Annual Exam    Subjective:  [name] is a 47 y.o. male present for CPE. All past medical history, surgical history, allergies, family history, immunizations, medications and social history were updated in the electronic medical record today. All recent labs, ED visits and hospitalizations within the last year were reviewed.  Health maintenance:  Colonoscopy: No fhx, screen at 50.  Immunizations:  tdap updated today, influenza (encouraged yearly) Infectious disease screening: HIV completed 1991 PSA: No results found for: PSA, pt was counseled on prostate cancer. No fhx, no urinary changes  Assistive device: none Oxygen HLK:TGYB Patient has a Dental home. Hospitalizations/ED visits: none  Depression screen [facility] 2/9 05/20/2018  Decreased Interest 0  Down, Depressed, Hopeless 0  PHQ - 2 Score 0   No flowsheet data found.   Current Exercise Habits: The patient has a physically strenous job, but has no regular exercise apart from work., Type of exercise: walking, Intensity: Mild   Fall Risk  05/20/2018  Falls in the past year? No    Immunization History  Administered Date(s) Administered  . Td 02/14/2009, 05/20/2018    Past Medical History:  Diagnosis Date  . Chicken pox   . Hyperlipidemia   . Wears glasses    No Known Allergies Past Surgical History:  Procedure Laterality Date  . 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 PAROTIDECTOMY Left 02/11/2015   Procedure: LEFT PAROTIDECTOMY;  Surgeon: Izora Gala, MD;  Location: [facility];  Service: ENT;  Laterality: Left;  Marland Kitchen VASECTOMY     Family History  Problem Relation Age of Onset  . Heart disease Father   . Heart  disease Mother   . Multiple sclerosis Sister   . Heart disease Paternal Grandfather   . Heart disease Paternal Grandmother    Social History   Socioeconomic History  . Marital status: Married    Spouse name: Lattie Haw  . Number of children: 2  . Years of education: Not on file  . Highest education level: Not on file  Occupational History  . Occupation: [employer]    Employer: [employer]    Comment: loads trucks  Social Needs  . Financial resource strain: Not on file  . Food insecurity:    Worry: Not on file    Inability: Not on file  . Transportation needs:    Medical: Not on file    Non-medical: Not on file  Tobacco Use  . Smoking status: Current Some Day Smoker    Packs/day: 0.25    Years: 15.00    Pack years: 3.75    Types: Cigarettes, Cigars  . Smokeless tobacco: Former Systems developer  . Tobacco comment: smokes about 1-3 cig/d--> had smoked 1-2 packs a day at one time  Substance and Sexual Activity  . Alcohol use: Yes    Alcohol/week: 7.2 oz    Types: 12 Standard drinks or equivalent per week    Comment: occ  . Drug use: No  . Sexual activity: Yes    Partners: Female    Comment: somes off and on  Lifestyle  . Physical activity:    Days per week: Not on file  Minutes per session: Not on file  . Stress: Not on file  Relationships  . Social connections:    Talks on phone: Not on file    Gets together: Not on file    Attends religious service: Not on file    Active member of club or organization: Not on file    Attends meetings of clubs or organizations: Not on file    Relationship status: Not on file  . Intimate partner violence:    Fear of current or ex partner: Not on file    Emotionally abused: Not on file    Physically abused: Not on file    Forced sexual activity: Not on file  Other Topics Concern  . Not on file  Social History Narrative   Lives with wife and 2 children.    Allergies as of 05/20/2018   No Known Allergies     Medication List    as of 05/20/2018  9:30  AM   You have not been prescribed any medications.    All past medical history, surgical history, allergies, family history, immunizations andmedications were updated in the EMR today and reviewed under the history and medication portions of their EMR.     No results found for this or any previous visit (from the past 2160 hour(s)).  No results found.   ROS: 14 pt review of systems performed and negative (unless mentioned in an HPI)  Objective: BP 124/85 (BP Location: Right Arm, Patient Position: Sitting, Cuff Size: Normal)   Pulse (!) 50   Temp 98.2 F (36.8 C)   Resp 20   Ht _0  (1.676 m)   Wt 142 lb 8 oz (64.6 kg)   SpO2 98%   BMI 23.00 kg/m  Gen: Afebrile. No acute distress. Nontoxic in appearance, well-developed, well-nourished,  Pleasant, caucasian, male HENT: AT. [place]. Bilateral TM visualized and normal in appearance, normal external auditory canal. MMM, no oral lesions, adequate dentition. Bilateral nares within normal limits. Throat without erythema, ulcerations or exudates. no Cough on exam, no hoarseness on exam. Eyes:Pupils Equal Round Reactive to light, Extraocular movements intact,  Conjunctiva without redness, discharge or icterus. Neck/lymp/endocrine: Supple,no lymphadenopathy, no thyromegaly CV: RRR no murmur, no edema, +2/4 P posterior tibialis pulses. no carotid bruits. No JVD. Chest: CTAB, no wheeze, rhonchi or crackles. normal Respiratory effort. good Air movement. Abd: Soft. flat. NTND. BS present. no Masses palpated. No hepatosplenomegaly. No rebound tenderness or guarding. Skin: no rashes, purpura or petechiae. Warm and well-perfused. Skin intact. Neuro/Msk:  Normal gait. PERLA. EOMi. Alert. Oriented x3.  Cranial nerves II through XII intact. Muscle strength 5/5 upper/lower extremity. DTRs equal bilaterally. Psych: Normal affect, dress and demeanor. Normal speech. Normal thought content and judgment.  No exam data present  Assessment/plan: [name]  is a 47 y.o. male present for CPE Screening, anemia, deficiency, iron - CBC w/Diff Mixed hyperlipidemi - Comp Met (CMET) - Lipid panel - TSH Diabetes mellitus screening - HgB A1c Encounter for smoking cessation counseling/Tobacco use disorder > 14m- <10 min spent on counseling.  Patient was asked about smoking history. They were advised  to quit smoking in a clear, strong and personalized manner. Their willingness to quit smoking was assessed and felt to be in  contemplation phase. They were offered assistance in cessation and options were explained to them today.  Patient declined options, but is working on it. He understands this is places him at > CV risk with his Fhx.   Encounter for  preventive health examination Patient was encouraged to exercise greater than 150 minutes a week. Patient was encouraged to choose a diet filled with fresh fruits and vegetables, and lean meats. AVS provided to patient today for education/recommendation on gender specific health and safety maintenance. Colonoscopy: No fhx, screen at 50.  Immunizations:  tdap updated today, influenza (encouraged yearly) Infectious disease screening: HIV completed 1991 PSA:  pt was counseled on prostate cancer screening. No fhx, no urinary changes    Return in about 1 year (around 05/21/2019) for CPE.  Note is dictated utilizing voice recognition software. Although note has been proof read prior to signing, occasional typographical errors still can be missed. If any questions arise, please do not hesitate to call for verification.  Electronically signed by: Howard Pouch, DO [place]

## 2018-05-20 NOTE — Patient Instructions (Signed)
Health Maintenance, Male A healthy lifestyle and preventive care is important for your health and wellness. Ask your health care provider about what schedule of regular examinations is right for you. What should I know about weight and diet? Eat a Healthy Diet  Eat plenty of vegetables, fruits, whole grains, low-fat dairy products, and lean protein.  Do not eat a lot of foods high in solid fats, added sugars, or salt.  Maintain a Healthy Weight Regular exercise can help you achieve or maintain a healthy weight. You should:  Do at least 150 minutes of exercise each week. The exercise should increase your heart rate and make you sweat (moderate-intensity exercise).  Do strength-training exercises at least twice a week.  Watch Your Levels of Cholesterol and Blood Lipids  Have your blood tested for lipids and cholesterol every 5 years starting at 47 years of age. If you are at high risk for heart disease, you should start having your blood tested when you are 47 years old. You may need to have your cholesterol levels checked more often if: ? Your lipid or cholesterol levels are high. ? You are older than 47 years of age. ? You are at high risk for heart disease.  What should I know about cancer screening? Many types of cancers can be detected early and may often be prevented. Lung Cancer  You should be screened every year for lung cancer if: ? You are a current smoker who has smoked for at least 30 years. ? You are a former smoker who has quit within the past 15 years.  Talk to your health care provider about your screening options, when you should start screening, and how often you should be screened.  Colorectal Cancer  Routine colorectal cancer screening usually begins at 47 years of age and should be repeated every 5-10 years until you are 47 years old. You may need to be screened more often if early forms of precancerous polyps or small growths are found. Your health care provider  may recommend screening at an earlier age if you have risk factors for colon cancer.  Your health care provider may recommend using home test kits to check for hidden blood in the stool.  A small camera at the end of a tube can be used to examine your colon (sigmoidoscopy or colonoscopy). This checks for the earliest forms of colorectal cancer.  Prostate and Testicular Cancer  Depending on your age and overall health, your health care provider may do certain tests to screen for prostate and testicular cancer.  Talk to your health care provider about any symptoms or concerns you have about testicular or prostate cancer.  Skin Cancer  Check your skin from head to toe regularly.  Tell your health care provider about any new moles or changes in moles, especially if: ? There is a change in a mole's size, shape, or color. ? You have a mole that is larger than a pencil eraser.  Always use sunscreen. Apply sunscreen liberally and repeat throughout the day.  Protect yourself by wearing long sleeves, pants, a wide-brimmed hat, and sunglasses when outside.  What should I know about heart disease, diabetes, and high blood pressure?  If you are 18-39 years of age, have your blood pressure checked every 3-5 years. If you are 40 years of age or older, have your blood pressure checked every year. You should have your blood pressure measured twice-once when you are at a hospital or clinic, and once when   you are not at a hospital or clinic. Record the average of the two measurements. To check your blood pressure when you are not at a hospital or clinic, you can use: ? An automated blood pressure machine at a pharmacy. ? A home blood pressure monitor.  Talk to your health care provider about your target blood pressure.  If you are between 48-63 years old, ask your health care provider if you should take aspirin to prevent heart disease.  Have regular diabetes screenings by checking your fasting blood  sugar level. ? If you are at a normal weight and have a low risk for diabetes, have this test once every three years after the age of 38. ? If you are overweight and have a high risk for diabetes, consider being tested at a younger age or more often.  A one-time screening for abdominal aortic aneurysm (AAA) by ultrasound is recommended for men aged 19-75 years who are current or former smokers. What should I know about preventing infection? Hepatitis B If you have a higher risk for hepatitis B, you should be screened for this virus. Talk with your health care provider to find out if you are at risk for hepatitis B infection. Hepatitis C Blood testing is recommended for:  Everyone born from [birth year] through 1965.  Anyone with known risk factors for hepatitis C.  Sexually Transmitted Diseases (STDs)  You should be screened each year for STDs including gonorrhea and chlamydia if: ? You are sexually active and are younger than 47 years of age. ? You are older than 47 years of age and your health care provider tells you that you are at risk for this type of infection. ? Your sexual activity has changed since you were last screened and you are at an increased risk for chlamydia or gonorrhea. Ask your health care provider if you are at risk.  Talk with your health care provider about whether you are at high risk of being infected with HIV. Your health care provider may recommend a prescription medicine to help prevent HIV infection.  What else can I do?  Schedule regular health, dental, and eye exams.  Stay current with your vaccines (immunizations).  Do not use any tobacco products, such as cigarettes, chewing tobacco, and e-cigarettes. If you need help quitting, ask your health care provider.  Limit alcohol intake to no more than 2 drinks per day. One drink equals 12 ounces of beer, 5 ounces of wine, or 1 ounces of hard liquor.  Do not use street drugs.  Do not share needles.  Ask your  health care provider for help if you need support or information about quitting drugs.  Tell your health care provider if you often feel depressed.  Tell your health care provider if you have ever been abused or do not feel safe at home. This information is not intended to replace advice given to you by your health care provider. Make sure you discuss any questions you have with your health care provider. Document Released: 04/16/2008 Document Revised: 06/17/2016 Document Reviewed: 07/23/2015 Elsevier Interactive Patient Education  [employer].    Please help [place] help you:  We are honored you have chosen [facility] for your Primary Care home. Below you will find basic instructions that you may need to access in the future. Please help [place] help you by reading the instructions, which cover many of the frequent questions we experience.   Prescription refills and request:  -In order  to allow more efficient response time, please call your pharmacy for all refills. They will forward the request electronically to [place]. This allows for the quickest possible response. Request left on a nurse line can take longer to refill, since these are checked as time allows between office patients and other phone calls.  - refill request can take up to 3-5 working days to complete.  - If request is sent electronically and request is appropiate, it is usually completed in 1-2 business days.  - all patients will need to be seen routinely for all chronic medical conditions requiring prescription medications (see follow-up below). If you are overdue for follow up on your condition, you will be asked to make an appointment and we will call in enough medication to cover you until your appointment (up to 30 days).  - all controlled substances will require a face to face visit to request/refill.  - if you desire your prescriptions to go through a new pharmacy, and have an active script at original pharmacy, you will  need to call your pharmacy and have scripts transferred to new pharmacy. This is completed between the pharmacy locations and not by your provider.    Results: If any images or labs were ordered, it can take up to 1 week to get results depending on the test ordered and the lab/facility running and resulting the test. - Normal or stable results, which do not need further discussion, may be released to your mychart immediately with attached note to you. A call may not be generated for normal results. Please make certain to sign up for mychart. If you have questions on how to activate your mychart you can call the front office.  - If your results need further discussion, our office will attempt to contact you via phone, and if unable to reach you after 2 attempts, we will release your abnormal result to your mychart with instructions.  - All results will be automatically released in mychart after 1 week.  - Your provider will provide you with explanation and instruction on all relevant material in your results. Please keep in mind, results and labs may appear confusing or abnormal to the untrained eye, but it does not mean they are actually abnormal for you personally. If you have any questions about your results that are not covered, or you desire more detailed explanation than what was provided, you should make an appointment with your provider to do so.   Our office handles many outgoing and incoming calls daily. If we have not contacted you within 1 week about your results, please check your mychart to see if there is a message first and if not, then contact our office.  In helping with this matter, you help decrease call volume, and therefore allow [place] to be able to respond to patients needs more efficiently.   Acute office visits (sick visit):  An acute visit is intended for a new problem and are scheduled in shorter time slots to allow schedule openings for patients with new problems. This is the  appropriate visit to discuss a new problem. Problems will not be addressed by phone call or Echart message. Appointment is needed if requesting treatment. In order to provide you with excellent quality medical care with proper time for you to explain your problem, have an exam and receive treatment with instructions, these appointments should be limited to one new problem per visit. If you experience a new problem, in which you desire to be  addressed, please make an acute office visit, we save openings on the schedule to accommodate you. Please do not save your new problem for any other type of visit, let us take care of it properly and quickly for you.   Follow up visits:  Depending on your condition(s) your provider will need to see you routinely in order to provide you with quality care and prescribe medication(s). Most chronic conditions (Example: hypertension, Diabetes, depression/anxiety... etc), require visits a couple times a year. Your provider will instruct you on proper follow up for your personal medical conditions and history. Please make certain to make follow up appointments for your condition as instructed. Failing to do so could result in lapse in your medication treatment/refills. If you request a refill, and are overdue to be seen on a condition, we will always provide you with a 30 day script (once) to allow you time to schedule.    Medicare wellness (well visit): - we have a wonderful Nurse Maudie Mercury), that will meet with you and provide you will yearly medicare wellness visits. These visits should occur yearly (can not be scheduled less than 1 calendar year apart) and cover preventive health, immunizations, advance directives and screenings you are entitled to yearly through your medicare benefits. Do not miss out on your entitled benefits, this is when medicare will pay for these benefits to be ordered for you.  These are strongly encouraged by your provider and is the appropriate type of  visit to make certain you are up to date with all preventive health benefits. If you have not had your medicare wellness exam in the last 12 months, please make certain to schedule one by calling the office and schedule your medicare wellness with Maudie Mercury as soon as possible.   Yearly physical (well visit):  - Adults are recommended to be seen yearly for physicals. Check with your insurance and date of your last physical, most insurances require one calendar year between physicals. Physicals include all preventive health topics, screenings, medical exam and labs that are appropriate for gender/age and history. You may have fasting labs needed at this visit. This is a well visit (not a sick visit), new problems should not be covered during this visit (see acute visit).  - Pediatric patients are seen more frequently when they are younger. Your provider will advise you on well child visit timing that is appropriate for your their age. - This is not a medicare wellness visit. Medicare wellness exams do not have an exam portion to the visit. Some medicare companies allow for a physical, some do not allow a yearly physical. If your medicare allows a yearly physical you can schedule the medicare wellness with our nurse Maudie Mercury and have your physical with your provider after, on the same day. Please check with insurance for your full benefits.   Late Policy/No Shows:  - all new patients should arrive 15-30 minutes earlier than appointment to allow [place] time  to  obtain all personal demographics,  insurance information and for you to complete office paperwork. - All established patients should arrive 10-15 minutes earlier than appointment time to update all information and be checked in .  - In our best efforts to run on time, if you are late for your appointment you will be asked to either reschedule or if able, we will work you back into the schedule. There will be a wait time to work you back in the schedule,  depending  on availability.  - If  you are unable to make it to your appointment as scheduled, please call 24 hours ahead of time to allow [place] to fill the time slot with someone else who needs to be seen. If you do not cancel your appointment ahead of time, you may be charged a no show fee.

## 2018-05-23 ENCOUNTER — Telehealth: Payer: Self-pay | Admitting: Family Medicine

## 2018-05-23 DIAGNOSIS — E782 Mixed hyperlipidemia: Secondary | ICD-10-CM

## 2018-05-23 MED ORDER — ATORVASTATIN CALCIUM 10 MG PO TABS: 10.0000 mg | ORAL_TABLET | Freq: Every day | ORAL | 1 refills | Status: AC

## 2018-05-23 NOTE — Telephone Encounter (Signed)
Patient will call back to schedule his appt

## 2018-05-23 NOTE — Telephone Encounter (Signed)
Lipitor prescribed. Please make sure he is scheduled for f/u in 3 months with provider.

## 2018-05-23 NOTE — Telephone Encounter (Signed)
Spoke with patient reviewed lab results and instructions. Patient verbalized understanding. Patient is willing to try low dose statin he will call back after starting medication to schedule 3 month follow up

## 2018-05-23 NOTE — Telephone Encounter (Signed)
Please inform patient the following information: His labs are all normal, with the exception of his cholesterol. His bad cholesterol "LDL" is 143. With his Fhx of Heart disease his goal should be LDL <100. Diet low in saturated fats, higher fiber and increase exercise > 150 min a week will help lower levels.  - In order to provided him with some cardiovascular protection I would recommend a low dose statin medication. Adding this medication to diet/exercise gives some protection against heart attack/heart disease, which runs strongly in his family. If he would like to add this protection, I will call it for him and he will follow up in 3 months for provider appt -fasting for re-check.

## 2019-04-27 ENCOUNTER — Other Ambulatory Visit: Payer: Self-pay

## 2019-04-27 ENCOUNTER — Encounter: Payer: Self-pay | Admitting: Family Medicine

## 2019-04-27 ENCOUNTER — Ambulatory Visit (INDEPENDENT_AMBULATORY_CARE_PROVIDER_SITE_OTHER): Payer: Managed Care, Other (non HMO) | Admitting: Family Medicine

## 2019-04-27 VITALS — BP 122/80 | HR 76 | Temp 97.6°F | Resp 17 | Ht 66.0 in | Wt 129.0 lb

## 2019-04-27 DIAGNOSIS — S80862A Insect bite (nonvenomous), left lower leg, initial encounter: Secondary | ICD-10-CM

## 2019-04-27 DIAGNOSIS — W57XXXA Bitten or stung by nonvenomous insect and other nonvenomous arthropods, initial encounter: Secondary | ICD-10-CM | POA: Diagnosis not present

## 2019-04-27 MED ORDER — DOXYCYCLINE HYCLATE 100 MG PO TABS: 100.0000 mg | ORAL_TABLET | Freq: Two times a day (BID) | ORAL | 0 refills | Status: AC

## 2019-04-27 NOTE — Progress Notes (Signed)
[name] , 25-Dec-1970, 48 y.o., male MRN: [id number] Patient Care Team    Relationship Specialty Notifications Start End  Ma Hillock, DO PCP - General Family Medicine  01/14/16     Chief Complaint  Patient presents with  . Insect Bite    Pt noticed bug bite x2 days ago on left lower leg and turned red. Pt does not rememeber getting a bug bite or tick bite. Denies fever or headache,      Subjective: [name] is a 48 y.o. Pt presents for an OV with complaints of red itchy area on his left lateral calf of 2 days duration.  Patient reports he did not find an insect but only noticed the bites 2 days ago secondary to itchiness.  He [place] and commonly sees ticks after being outside.  He denies any fever, chills, nausea, headache, myalgias or rash outside of the redness surrounding the presumed insect bite which has a black center.  Depression screen PHQ 2/9 05/20/2018  Decreased Interest 0  Down, Depressed, Hopeless 0  PHQ - 2 Score 0    No Known Allergies Social History   Social History Narrative   Lives with wife and 2 children.    Past Medical History:  Diagnosis Date  . Chicken pox   . Hyperlipidemia   . Wears glasses    Past Surgical History:  Procedure Laterality Date  . 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 PAROTIDECTOMY Left 02/11/2015   Procedure: LEFT PAROTIDECTOMY;  Surgeon: Izora Gala, MD;  Location: [facility];  Service: ENT;  Laterality: Left;  Marland Kitchen VASECTOMY     Family History  Problem Relation Age of Onset  . Heart disease Father   . Heart disease Mother   . Multiple sclerosis Sister   . Heart disease Paternal Grandfather   . Heart disease Paternal Grandmother    Allergies as of 04/27/2019   No Known Allergies     Medication List       Accurate as of April 27, 2019 11:48 AM. If you have any questions, ask your nurse or doctor.        atorvastatin 10 MG tablet  Commonly known as: LIPITOR Take 1 tablet (10 mg total) by mouth daily.       All past medical history, surgical history, allergies, family history, immunizations andmedications were updated in the EMR today and reviewed under the history and medication portions of their EMR.     ROS: Negative, with the exception of above mentioned in HPI  Objective:  BP 122/80 (BP Location: Left Arm, Patient Position: Sitting, Cuff Size: Normal)   Pulse 76   Temp 97.6 F (36.4 C) (Temporal)   Resp 17   Ht 5\' 6"  (1.676 m)   Wt 129 lb (58.5 kg)   SpO2 100%   BMI 20.82 kg/m  Body mass index is 20.82 kg/m. Gen: Afebrile. No acute distress. Nontoxic in appearance, well developed, well nourished.  HENT: AT. [place].  MMM Eyes:Pupils Equal Round Reactive to light, Extraocular movements intact,  Conjunctiva without redness, discharge or icterus. Neck/lymp/endocrine: Supple, no lymphadenopathy Skin: No rashes, purpura or petechiae.  Insect bite left lateral calf with surrounding erythema approximately 1 inch in diameter with blackened center.  Closer examination with magnification loops showed possible insect/tick remnants.  Area was cleansed with alcohol swab and a sterile 18-gauge needle was used to remove tick remnants  from scab formation.  Patient tolerated removal well.  No anesthesia was needed since superficial. Neuro: Normal gait. PERLA. EOMi. Alert. Oriented x3  No exam data present No results found. No results found for this or any previous visit (from the past 24 hour(s)).  Assessment/Plan: [name] is a 48 y.o. male present for OV for  Tick bite of left lower leg, initial encounter -On modification it did appear there was some remnants of an insect/tick embedded within healing scab.  Area was cleansed with alcohol swab and remnants were easily removed with the tip of an 18-gauge sterile needle.  No anesthesia was needed secondary to superficial location.  Patient tolerated well.  Remnants  removed.  Patient was started on doxycycline twice daily x10 states for potential Lyme exposure.  He is outside the window of the one-time prophylactic dose.  This was discussed with him today and he did elect to have the 10-day treatment. Patient to monitor for any fever, chills, myalgias, headaches or rash.  He is to keep area clean and dry daily, applying antibiotic ointment 1-2 times a day.  Follow-up only if needed.   Reviewed expectations re: course of current medical issues.  Discussed self-management of symptoms.  Outlined signs and symptoms indicating need for more acute intervention.  Patient verbalized understanding and all questions were answered.  Patient received an After-Visit Summary.    No orders of the defined types were placed in this encounter.   > 15 minutes spent with patient, > 50% of that time face to face   Note is dictated utilizing voice recognition software. Although note has been proof read prior to signing, occasional typographical errors still can be missed. If any questions arise, please do not hesitate to call for verification.   electronically signed by:  Howard Pouch, DO  [place]

## 2019-04-27 NOTE — Patient Instructions (Signed)
Keep clean and covered, use antibiotic ointment daily.  Start doxycyline every 12 hours for 10 days.  Monitor for fever, chills, rash, headache or muscle aches>> which would mean either LYME or RMSF. Doxycyline will prevent this from occurring.   [place] Spotted Fever [place] spotted fever is a bacterial infection that spreads to people through contact with certain ticks. The illness causes flu-like symptoms and a reddish-purple rash. The illness does not spread from person to person (is not contagious). When this condition is not treated right away, it can quickly become very serious, and can sometimes lead to long-term (chronic) health problems or even death. What are the causes? This condition is caused by a type of bacteria (Rickettsia rickettsii) that is carried by [place] dog ticks, brown dog ticks, and [employer] wood ticks. The infection spreads through:  A bite from an infected tick. Tick bites are usually painless, and they frequently are not noticed.  Infected tick blood, body fluids, or feces that get into the body through damaged skin, such as a small cut or sore. This could happen while removing a tick from a pet or from another person. What increases the risk? The following factors may make you more likely to develop this condition:  Spending a lot of time outdoors, especially in rural areas or areas with long grass.  Spending time outdoors during warm weather. Ticks are most active during warm weather. What are the signs or symptoms? Symptoms of this condition include:  Fever.  Muscle aches.  Headache.  Nausea.  Vomiting.  Poor appetite.  Abdominal pain.  A reddish-purple rash. ? This usually appears 2-5 days after the first symptoms begin. ? The rash often starts on the wrists, forearms, and ankles. It may then spread to the palms, the bottom of the feet, legs, and trunk. Symptoms may develop 2-14 days after a tick bite. How is this  diagnosed? This condition is diagnosed based on:  Your medical history.  A physical exam.  Blood tests.  Whether you have recently been bitten by a tick or spent time in areas where: ? Ticks are common. ? [facility] spotted fever is common. How is this treated? This condition is treated with antibiotic medicines. It is important to begin treatment right away. In some cases, your health care provider may begin treatment before the diagnosis is confirmed. If your symptoms are severe, you may need to be treated in the hospital where you can get antibiotics and be monitored during treatment. Follow these instructions at home:  Take over-the-counter and prescription medicines only as told by your health care provider.  Take your antibiotic medicine as told by your health care provider. Do not stop taking the antibiotic even if you start to feel better.  Rest as much as possible until you feel better. Return to your normal activities as told by your health care provider.  Drink enough fluid to keep your urine pale yellow.  Keep all follow-up visits as told by your health care provider. This is important. Contact a health care provider if:  You have a rash that gets increasingly red or swollen.  You have fluid draining from any areas of your rash. Get help right away if:  You develop a fever after being bitten by a tick.  You develop a rash 2-5 days after experiencing flu-like symptoms.  You have chest pain.  You have shortness of breath.  You have a severe headache.  You have jerky movements you cannot  control (seizure).  You have severe abdominal pain.  You feel confused.  You bruise easily.  You have bleeding from your gums.  You have blood in your stool (feces).  You have trouble controlling when you urinate or have bowel movements (incontinence).  You have vision problems.  You have numbness or tingling in your arms or legs. Summary  [facility]  spotted fever is a bacterial infection that spreads to people through contact with certain ticks.  When this condition is not treated right away, it can quickly become very serious, and can sometimes lead to long-term (chronic) health problems or even death.  You are more likely to develop this infection if you spend time outdoors in warm weather and in areas with tall grass.  Symptoms of this condition include fever, headache, nausea, vomiting, abdominal pain, muscle aches, and a reddish-purple rash that usually appears 2-5 days after a fever.  This condition is treated with antibiotic medicines. This information is not intended to replace advice given to you by your health care provider. Make sure you discuss any questions you have with your health care provider. Document Released: 01/31/2001 Document Revised: 01/14/2017 Document Reviewed: 01/14/2017 Elsevier Interactive Patient Education  2019 [employer]    Lyme Disease  Lyme disease is an infection that affects many parts of the body, including the skin, joints, and nervous system. It is a bacterial infection that starts from the bite of an infected tick. The infection can spread, and some of the symptoms are similar to the flu. If Lyme disease is not treated, it may cause joint pain, swelling, numbness, problems thinking, fatigue, muscle weakness, and other problems. What are the causes? This condition is caused by bacteria called Borrelia burgdorferi. You can get Lyme disease by being bitten by an infected tick. The tick must be attached to your skin to pass along the infection. Deer often carry infected ticks. What increases the risk? The following factors may make you more likely to develop this condition:  Living in or visiting these areas in the [place] ? [place]. ? [place]. ? [place].  Spending time in wooded or grassy areas.  Being outdoors with exposed skin.  Camping, gardening, hiking,  fishing, or hunting outdoors.  Failing to remove a tick from your skin within 3-4 days. What are the signs or symptoms? Symptoms of this condition include:  A round, red rash that surrounds the center of the tick bite. This is the first sign of infection. The center of the rash may be blood colored or have tiny blisters.  Fatigue.  Headache.  Chills and fever.  General achiness.  Joint pain, often in the knees.  Muscle pain.  Swollen lymph glands.  Stiff neck. How is this diagnosed? This condition is diagnosed based on:  Your symptoms and medical history.  A physical exam.  A blood test. How is this treated? The main treatment for this condition is antibiotic medicine, which is usually taken by mouth (orally). The length of treatment depends on how soon after a tick bite you begin taking the medicine. In some cases, treatment is necessary for several weeks. If the infection is severe, antibiotics may need to be given through an IV tube that is inserted into one of your veins. Follow these instructions at home:  Take your antibiotic medicine as told by your health care provider. Do not stop taking the antibiotic even if you start to feel better.  Ask your health care provider  about takinga probiotic in between doses of your antibiotic to help avoid stomach upset or diarrhea.  Check with your health care provider before supplementing your treatment. Many alternative therapies have not been proven and may be harmful to you.  Keep all follow-up visits as told by your health care provider. This is important. How is this prevented? You can become reinfected if you get another tick bite from an infected tick. Take these steps to help prevent an infection:  Cover your skin with light-colored clothing when you are outdoors in the spring and summer months.  Spray clothing and skin with bug spray. The spray should be 20-30% DEET.  Avoid wooded, grassy, and shaded areas.   Remove yard litter, brush, trash, and plants that attract deer and rodents.  Check yourself for ticks when you come indoors.  Wash clothing worn each day.  Check your pets for ticks before they come inside.  If you find a tick: ? Remove it with tweezers. ? Clean your hands and the bite area with rubbing alcohol or soap and water. Pregnant women should take special care to avoid tick bites because the infection can be passed along to the fetus. Contact a health care provider if:  You have symptoms after treatment.  You have removed a tick and want to bring it to your health care provider for testing. Get help right away if:  You have an irregular heartbeat.  You have nerve pain.  Your face feels numb. This information is not intended to replace advice given to you by your health care provider. Make sure you discuss any questions you have with your health care provider. Document Released: 01/25/2001 Document Revised: 06/09/2016 Document Reviewed: 06/09/2016 Elsevier Interactive Patient Education  2019 [employer].

## 2019-07-31 ENCOUNTER — Other Ambulatory Visit: Payer: Self-pay

## 2019-07-31 DIAGNOSIS — Z20822 Contact with and (suspected) exposure to covid-19: Secondary | ICD-10-CM

## 2019-08-02 LAB — NOVEL CORONAVIRUS, NAA: SARS-CoV-2, NAA: NOT DETECTED

## 2020-07-23 ENCOUNTER — Other Ambulatory Visit: Payer: Managed Care, Other (non HMO)

## 2020-07-23 DIAGNOSIS — Z20822 Contact with and (suspected) exposure to covid-19: Secondary | ICD-10-CM

## 2020-07-25 LAB — SARS-COV-2, NAA 2 DAY TAT

## 2020-07-25 LAB — NOVEL CORONAVIRUS, NAA: SARS-CoV-2, NAA: NOT DETECTED

## 2020-08-07 ENCOUNTER — Other Ambulatory Visit: Payer: Managed Care, Other (non HMO)

## 2020-08-07 DIAGNOSIS — Z20822 Contact with and (suspected) exposure to covid-19: Secondary | ICD-10-CM

## 2020-08-08 LAB — NOVEL CORONAVIRUS, NAA: SARS-CoV-2, NAA: NOT DETECTED

## 2020-08-08 LAB — SARS-COV-2, NAA 2 DAY TAT

## 2020-10-11 ENCOUNTER — Other Ambulatory Visit: Payer: Managed Care, Other (non HMO)

## 2020-10-11 ENCOUNTER — Other Ambulatory Visit: Payer: Self-pay

## 2020-10-11 DIAGNOSIS — Z20822 Contact with and (suspected) exposure to covid-19: Secondary | ICD-10-CM

## 2020-10-12 LAB — SARS-COV-2, NAA 2 DAY TAT

## 2020-10-12 LAB — NOVEL CORONAVIRUS, NAA: SARS-CoV-2, NAA: DETECTED — AB

## 2021-01-04 ENCOUNTER — Emergency Department (HOSPITAL_BASED_OUTPATIENT_CLINIC_OR_DEPARTMENT_OTHER): Payer: 59

## 2021-01-04 ENCOUNTER — Other Ambulatory Visit: Payer: Self-pay

## 2021-01-04 ENCOUNTER — Emergency Department (HOSPITAL_BASED_OUTPATIENT_CLINIC_OR_DEPARTMENT_OTHER)
Admission: EM | Admit: 2021-01-04 | Discharge: 2021-01-04 | Disposition: A | Discharge status: Home / Self Care | Payer: 59 | Attending: Emergency Medicine | Admitting: Emergency Medicine

## 2021-01-04 ENCOUNTER — Encounter (HOSPITAL_BASED_OUTPATIENT_CLINIC_OR_DEPARTMENT_OTHER): Payer: Self-pay | Admitting: Emergency Medicine

## 2021-01-04 DIAGNOSIS — S99912A Unspecified injury of left ankle, initial encounter: Secondary | ICD-10-CM | POA: Diagnosis present

## 2021-01-04 DIAGNOSIS — F17210 Nicotine dependence, cigarettes, uncomplicated: Secondary | ICD-10-CM | POA: Diagnosis not present

## 2021-01-04 DIAGNOSIS — X501XXA Overexertion from prolonged static or awkward postures, initial encounter: Secondary | ICD-10-CM | POA: Diagnosis not present

## 2021-01-04 DIAGNOSIS — S82832A Other fracture of upper and lower end of left fibula, initial encounter for closed fracture: Secondary | ICD-10-CM | POA: Diagnosis not present

## 2021-01-04 DIAGNOSIS — Z79899 Other long term (current) drug therapy: Secondary | ICD-10-CM | POA: Diagnosis not present

## 2021-01-04 MED ORDER — OXYCODONE-ACETAMINOPHEN 5-325 MG PO TABS
1.0000 | ORAL_TABLET | Freq: Once | ORAL | Status: AC
  Administered 2021-01-04: 1 via ORAL
  Filled 2021-01-04: qty 1

## 2021-01-04 NOTE — ED Triage Notes (Signed)
Reports he turned his left ankle while playing basketball and heard a cracking noise.  Pain with bearing weight.

## 2021-01-04 NOTE — ED Provider Notes (Signed)
[facility] EMERGENCY DEPARTMENT Provider Note   CSN: [id number] Arrival date & time: 01/04/21  1856     History Chief Complaint  Patient presents with  . Ankle Pain    [name] is a 50 y.o. male.  Presented to the emergency room with concern for ankle pain.  Rolled ankle while playing basketball, heard cracking noise.  Pain is worse with movement, is able to bear weight.  Sharp and stabbing.  Nonradiating.  Has not taken any medicine yet.  HPI     Past Medical History:  Diagnosis Date  . Chicken pox   . Hyperlipidemia   . Wears glasses     Patient Active Problem List   Diagnosis Date Noted  . Encounter for smoking cessation counseling 05/20/2018  . Tobacco use disorder 05/20/2018  . Hyperlipidemia   . Parotid mass 02/11/2015  . Lymphadenopathy, preauricular 09/10/2014  . Family history of heart disease in male family member before age 80 04/19/2013  . Family history of heart disease in male family member before age 81 04/19/2013    Past Surgical History:  Procedure Laterality Date  . 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 PAROTIDECTOMY Left 02/11/2015   Procedure: LEFT PAROTIDECTOMY;  Surgeon: Izora Gala, MD;  Location: [facility];  Service: ENT;  Laterality: Left;  Marland Kitchen VASECTOMY         Family History  Problem Relation Age of Onset  . Heart disease Father   . Heart disease Mother   . Multiple sclerosis Sister   . Heart disease Paternal Grandfather   . Heart disease Paternal Grandmother     Social History   Tobacco Use  . Smoking status: Current Some Day Smoker    Packs/day: 0.25    Years: 15.00    Pack years: 3.75    Types: Cigarettes, Cigars  . Smokeless tobacco: Former Systems developer  . Tobacco comment: smokes about 1-3 cig/d--> had smoked 1-2 packs a day at one time  Vaping Use  . Vaping Use: Never used  Substance Use Topics  . Alcohol use: Yes    Alcohol/week: 12.0 standard drinks     Types: 12 Standard drinks or equivalent per week    Comment: occ  . Drug use: No    Home Medications Prior to Admission medications   Medication Sig Start Date End Date Taking? Authorizing Provider  atorvastatin (LIPITOR) 10 MG tablet Take 1 tablet (10 mg total) by mouth daily. Patient not taking: No sig reported 05/23/18   Kuneff, Renee A, DO  doxycycline (VIBRA-TABS) 100 MG tablet Take 1 tablet (100 mg total) by mouth 2 (two) times daily. 04/27/19   Kuneff, Renee A, DO    Allergies    Patient has no known allergies.  Review of Systems   Review of Systems  Musculoskeletal: Positive for arthralgias.  All other systems reviewed and are negative.   Physical Exam Updated Vital Signs BP 129/86 (BP Location: Right Arm)   Pulse 70   Temp 98.1 F (36.7 C) (Oral)   Resp 18   Ht 5\' 4"  (1.626 m)   Wt 63.5 kg   SpO2 100%   BMI 24.03 kg/m   Physical Exam Vitals and nursing note reviewed.  Constitutional:      Appearance: He is well-developed and well-nourished.  HENT:     Head: Normocephalic and atraumatic.  Eyes:     Conjunctiva/sclera: Conjunctivae normal.  Cardiovascular:     Rate and  Rhythm: Normal rate.     Pulses: Normal pulses.  Pulmonary:     Effort: Pulmonary effort is normal. No respiratory distress.  Abdominal:     Palpations: Abdomen is soft.     Tenderness: There is no abdominal tenderness.  Musculoskeletal:        General: No edema.     Cervical back: Neck supple.     Comments: Left lower extremity: There is some tenderness to palpation over the lateral malleolus but otherwise normal-appearing leg, normal DP and PT pulses, sensation intact  Skin:    General: Skin is warm and dry.  Neurological:     Mental Status: He is alert.  Psychiatric:        Mood and Affect: Mood and affect normal.     ED Results / Procedures / Treatments   Labs (all labs ordered are listed, but only abnormal results are displayed) Labs Reviewed - No data to  display  EKG None  Radiology DG Ankle Complete Left  Result Date: 01/04/2021 CLINICAL DATA:  Left ankle injury playing basketball EXAM: LEFT ANKLE COMPLETE - 3+ VIEW COMPARISON:  None. FINDINGS: Circumferential soft tissue swelling of the ankle slightly more pronounced over the lateral malleolus with a small ankle joint effusion. There is a questionable lucency through the tip of the lateral malleolus which could reflect a small avulsive type fracture. No other discernible acute fracture line is seen. Ankle mortise is congruent. Midfoot and hindfoot alignment is grossly preserved though incompletely assessed on nondedicated, nonweightbearing films. IMPRESSION: 1. Circumferential soft tissue swelling of the ankle slightly more pronounced over the lateral malleolus with a small ankle joint effusion. 2. Questionable lucency through the tip of the lateral malleolus which could reflect a small avulsive type fracture. Electronically Signed   By: Lovena Le M.D.   On: 01/04/2021 19:54    Procedures Procedures   Medications Ordered in ED Medications  oxyCODONE-acetaminophen (PERCOCET/ROXICET) 5-325 MG per tablet 1 tablet (1 tablet Oral Given 01/04/21 2012)    ED Course  I have reviewed the triage vital signs and the nursing notes.  Pertinent labs & imaging results that were available during my care of the patient were reviewed by me and considered in my medical decision making (see chart for details).    MDM Rules/Calculators/A&P                         50 year old male with concern for ankle injury.  Questionable lucency through tip of lateral malleolus could reflect small avulsive type fracture.  He does have some point tenderness at this area of concern.  Will place in a walking boot.  Recommended weightbearing as tolerated, follow-up with Ortho.   After the discussed management above, the patient was determined to be safe for discharge.  The patient was in agreement with this plan and all  questions regarding their care were answered.  ED return precautions were discussed and the patient will return to the ED with any significant worsening of condition.   Final Clinical Impression(s) / ED Diagnoses Final diagnoses:  Closed fracture of distal end of left fibula, unspecified fracture morphology, initial encounter    Rx / DC Orders ED Discharge Orders    None       Lucrezia Starch, MD 01/05/21 2348

## 2021-01-04 NOTE — ED Notes (Signed)
AVS provided to client, reinforced instructions to call [facility] as per ED MD recommendations

## 2021-01-04 NOTE — Discharge Instructions (Addendum)
Recommend following up with orthopedics next week.  Recommend using boot as discussed.  Recommend weightbearing as tolerated.  Rest as possible.  Take Tylenol or Motrin for pain control.

## 2021-03-19 ENCOUNTER — Encounter (HOSPITAL_BASED_OUTPATIENT_CLINIC_OR_DEPARTMENT_OTHER): Payer: Self-pay | Admitting: Emergency Medicine
